# Patient Record
Sex: Female | Born: 1970
Health system: Southern US, Community
[De-identification: ages and names within clinical notes are randomized; demographics above are authoritative.]

## PROBLEM LIST (undated history)

## (undated) DIAGNOSIS — K219 Gastro-esophageal reflux disease without esophagitis: Secondary | ICD-10-CM

## (undated) DIAGNOSIS — N7093 Salpingitis and oophoritis, unspecified: Secondary | ICD-10-CM

## (undated) DIAGNOSIS — N946 Dysmenorrhea, unspecified: Secondary | ICD-10-CM

## (undated) DIAGNOSIS — M1990 Unspecified osteoarthritis, unspecified site: Secondary | ICD-10-CM

## (undated) DIAGNOSIS — M48061 Spinal stenosis, lumbar region without neurogenic claudication: Secondary | ICD-10-CM

## (undated) DIAGNOSIS — D689 Coagulation defect, unspecified: Secondary | ICD-10-CM

## (undated) DIAGNOSIS — R87619 Unspecified abnormal cytological findings in specimens from cervix uteri: Secondary | ICD-10-CM

## (undated) DIAGNOSIS — N736 Female pelvic peritoneal adhesions (postinfective): Secondary | ICD-10-CM

## (undated) DIAGNOSIS — R9082 White matter disease, unspecified: Secondary | ICD-10-CM

## (undated) DIAGNOSIS — K649 Unspecified hemorrhoids: Secondary | ICD-10-CM

## (undated) DIAGNOSIS — G629 Polyneuropathy, unspecified: Secondary | ICD-10-CM

## (undated) DIAGNOSIS — N920 Excessive and frequent menstruation with regular cycle: Secondary | ICD-10-CM

## (undated) DIAGNOSIS — I10 Essential (primary) hypertension: Secondary | ICD-10-CM

## (undated) DIAGNOSIS — T7840XA Allergy, unspecified, initial encounter: Secondary | ICD-10-CM

## (undated) DIAGNOSIS — D649 Anemia, unspecified: Secondary | ICD-10-CM

## (undated) DIAGNOSIS — F32A Depression, unspecified: Secondary | ICD-10-CM

## (undated) DIAGNOSIS — F419 Anxiety disorder, unspecified: Secondary | ICD-10-CM

## (undated) DIAGNOSIS — Z5189 Encounter for other specified aftercare: Secondary | ICD-10-CM

## (undated) HISTORY — DX: Polyneuropathy, unspecified: G62.9

## (undated) HISTORY — DX: Encounter for other specified aftercare: Z51.89

## (undated) HISTORY — PX: APPENDECTOMY: SHX54

## (undated) HISTORY — DX: Excessive and frequent menstruation with regular cycle: N92.0

## (undated) HISTORY — DX: Female pelvic peritoneal adhesions (postinfective): N73.6

## (undated) HISTORY — DX: Unspecified osteoarthritis, unspecified site: M19.90

## (undated) HISTORY — DX: Anxiety disorder, unspecified: F41.9

## (undated) HISTORY — DX: Anemia, unspecified: D64.9

## (undated) HISTORY — DX: Depression, unspecified: F32.A

## (undated) HISTORY — DX: Essential (primary) hypertension: I10

## (undated) HISTORY — DX: Unspecified hemorrhoids: K64.9

## (undated) HISTORY — DX: Dysmenorrhea, unspecified: N94.6

## (undated) HISTORY — PX: FRACTURE SURGERY: SHX138

## (undated) HISTORY — DX: Salpingitis and oophoritis, unspecified: N70.93

## (undated) HISTORY — DX: Unspecified abnormal cytological findings in specimens from cervix uteri: R87.619

## (undated) HISTORY — DX: Spinal stenosis, lumbar region without neurogenic claudication: M48.061

## (undated) HISTORY — DX: Gastro-esophageal reflux disease without esophagitis: K21.9

## (undated) HISTORY — PX: MANDIBLE SURGERY: SHX707

## (undated) HISTORY — DX: White matter disease, unspecified: R90.82

## (undated) HISTORY — DX: Coagulation defect, unspecified: D68.9

## (undated) HISTORY — DX: Allergy, unspecified, initial encounter: T78.40XA

---

## 2015-08-16 HISTORY — PX: ENDOMETRIAL ABLATION: SHX621

## 2015-10-10 DIAGNOSIS — N7093 Salpingitis and oophoritis, unspecified: Secondary | ICD-10-CM

## 2015-10-10 HISTORY — DX: Salpingitis and oophoritis, unspecified: N70.93

## 2015-10-10 HISTORY — PX: OOPHORECTOMY: SHX86

## 2016-01-21 ENCOUNTER — Encounter: Payer: Self-pay | Admitting: Physician Assistant

## 2016-01-21 ENCOUNTER — Ambulatory Visit (INDEPENDENT_AMBULATORY_CARE_PROVIDER_SITE_OTHER): Payer: Self-pay | Admitting: Physician Assistant

## 2016-01-21 VITALS — BP 157/102 | HR 63 | Wt 200.0 lb

## 2016-01-21 DIAGNOSIS — Z862 Personal history of diseases of the blood and blood-forming organs and certain disorders involving the immune mechanism: Secondary | ICD-10-CM

## 2016-01-21 DIAGNOSIS — G629 Polyneuropathy, unspecified: Secondary | ICD-10-CM

## 2016-01-21 DIAGNOSIS — I10 Essential (primary) hypertension: Secondary | ICD-10-CM

## 2016-01-21 DIAGNOSIS — I152 Hypertension secondary to endocrine disorders: Secondary | ICD-10-CM | POA: Insufficient documentation

## 2016-01-21 DIAGNOSIS — Z299 Encounter for prophylactic measures, unspecified: Secondary | ICD-10-CM

## 2016-01-21 DIAGNOSIS — G588 Other specified mononeuropathies: Secondary | ICD-10-CM

## 2016-01-21 DIAGNOSIS — Z206 Contact with and (suspected) exposure to human immunodeficiency virus [HIV]: Secondary | ICD-10-CM

## 2016-01-21 DIAGNOSIS — E559 Vitamin D deficiency, unspecified: Secondary | ICD-10-CM

## 2016-01-21 DIAGNOSIS — E1159 Type 2 diabetes mellitus with other circulatory complications: Secondary | ICD-10-CM | POA: Insufficient documentation

## 2016-01-21 HISTORY — DX: Polyneuropathy, unspecified: G62.9

## 2016-01-21 LAB — POCT HEMOGLOBIN: Hemoglobin: 14.1 g/dL (ref 12.2–16.2)

## 2016-01-21 MED ORDER — LISINOPRIL-HYDROCHLOROTHIAZIDE 20-25 MG PO TABS: 0.5000 | ORAL_TABLET | Freq: Every day | ORAL | 3 refills | Status: DC

## 2016-01-21 MED ORDER — GABAPENTIN 300 MG PO CAPS: ORAL_CAPSULE | ORAL | 3 refills | Status: DC

## 2016-01-21 NOTE — Progress Notes (Signed)
Jeanette [name] is a 45 y.o. female who presents to [facility] today to establish care  Subjective:   HTN: not currently on medication. Has taken "water pills" in the past. Denies headache, chest pain, dyspnea, lightheadedness, and edema. Eats a salt restricted diet. Family hx significant for HTN in first degree relatives.  Exposure to HIV:  Currently in a monogamous relationship with female partner who is HIV positive. He is taking anti-retrovirals and followed by ID at [facility]. She would like to be tested for HIV and is interested in pre-exposure prophylaxis. Denies fever, chills, fatigue, and malaise.  Peripheral neuropathy, right leg: states she developed sudden onset drop foot 6 months ago. She was admitted to the hospital, but does not know the cause of her neuropathy. Has been managed with Gabapentin, but has not taken medication in 2 weeks. Pain is worse at night and when lying down.  History of anemia: patient states she has a hx of severe anemia requiring monthly blood transfusions. She has a history of heavy menstrual periods and had an ablation two months ago. LMP was 2 months ago.   Current Outpatient Prescriptions  Medication Sig Dispense Refill  . gabapentin (NEURONTIN) 300 MG capsule One tab PO qHS for a week, then BID for a week, then TID. May double weekly to a max of 3,600mg /day 180 capsule 3  . lisinopril-hydrochlorothiazide (PRINZIDE,ZESTORETIC) 20-25 MG tablet Take 0.5 tablets by mouth daily. 30 tablet 3   No current facility-administered medications for this visit.      Past Medical History:  Diagnosis Date  . Anemia   . Hypertension    Surgical History: Salpingoophorectromy, right (2017)  Social History  Substance Use Topics  . Smoking status: Former Smoker    Quit date: 03/24/2015  . Smokeless tobacco: Never Used  . Alcohol use Yes   family  history includes Brain cancer in her brother; Diabetes in her brother, father, and mother; Heart attack in her father, paternal grandfather, and paternal grandmother; Hyperlipidemia in her father; Hypertension in her father and son.  ROS as above: negative except as noted in the HPI  Medications: Current Outpatient Prescriptions  Medication Sig Dispense Refill  . gabapentin (NEURONTIN) 300 MG capsule One tab PO qHS for a week, then BID for a week, then TID. May double weekly to a max of 3,600mg /day 180 capsule 3  . lisinopril-hydrochlorothiazide (PRINZIDE,ZESTORETIC) 20-25 MG tablet Take 0.5 tablets by mouth daily. 30 tablet 3   No current facility-administered medications for this visit.    No Known Allergies  Health Maintenance Health Maintenance  Topic Date Due  . HIV Screening  01/11/1984  . TETANUS/TDAP  01/11/1988  . PAP SMEAR  01/10/1990  . INFLUENZA VACCINE  09/09/2015     Exam:  BP (!) 157/102   Pulse 63   Wt 200 lb (90.7 kg)   Physical Exam  Constitutional: She appears healthy. No distress.  HENT:  Head: Normocephalic and atraumatic.  Eyes: EOM are normal.  Cardiovascular: Normal rate, regular rhythm, normal heart sounds and intact distal pulses.   No peripheral edema  Pulmonary/Chest: Effort normal and breath sounds normal.  Musculoskeletal: Normal range of motion.       Right upper leg: Normal.       Right lower leg: Normal.       Right foot: Normal.  No drop foot on right side noted today. Normal dorsiflexion and plantar flexion.  Neurological: She is alert. She has normal  sensation. She displays no tremor. Gait normal.  Skin: Skin is warm, dry and intact. No rash (on exposed skin) noted.  Psychiatric: Mood, affect and judgment normal.  Vitals reviewed.   Lab Results  Component Value Date   HGB 14.1 01/21/2016    Assessment and Plan: 45 y.o. female with uncontrolled HTN and peripheral neuropathy.  HTN: started low-dose Lisinopril-HCTZ and  instructed to return in 2 weeks for BP check  Peripheral neuropathy: refilled Gabapentin. Will titrate back to full dose. If pain is not well controlled, discussed adding another neuropathic pain agent and/or referral to sports medicine.  POCT Hgb performed in clinic today and normal. I suspect this was a microcytic iron deficiency anemia due to menorrhagia that has resolved since endometrial ablation.  Preventive care labs ordered today, including HIV screening. Also ordered screening labs for PreP initiation. Cost is a concern for her as she is self-pay. I have agreed to research programs in [facility] and contact [facility] to see if there is financial assistance.   Orders Placed This Encounter  Procedures  . HIV antibody  . Comprehensive metabolic panel    Order Specific Question:   Has the patient fasted?    Answer:   No  . CBC  . Hepatitis panel, acute  . Pregnancy, urine  . Vitamin B12  . Folate  . Iron and TIBC  . Ferritin  . Hemoglobin A1c  . Lipid panel    Order Specific Question:   Has the patient fasted?    Answer:   No  . VITAMIN D 25 Hydroxy (Vit-D Deficiency, Fractures)  . TSH  . POCT hemoglobin    Patient education and anticipatory guidance given Patient agrees with treatment plan Follow-up in 2 weeks or sooner as needed   Darlyne Russian PA-C

## 2016-01-21 NOTE — Patient Instructions (Signed)
Hypertension Hypertension, commonly called high blood pressure, is when the force of blood pumping through your arteries is too strong. Your arteries are the blood vessels that carry blood from your heart throughout your body. A blood pressure reading consists of a higher number over a lower number, such as 110/72. The higher number (systolic) is the pressure inside your arteries when your heart pumps. The lower number (diastolic) is the pressure inside your arteries when your heart relaxes. Ideally you want your blood pressure below 120/80. Hypertension forces your heart to work harder to pump blood. Your arteries may become narrow or stiff. Having untreated or uncontrolled hypertension can cause heart attack, stroke, kidney disease, and other problems. What increases the risk? Some risk factors for high blood pressure are controllable. Others are not. Risk factors you cannot control include:  Race. You may be at higher risk if you are African American.  Age. Risk increases with age.  Gender. Men are at higher risk than women before age 45 years. After age 65, women are at higher risk than men. Risk factors you can control include:  Not getting enough exercise or physical activity.  Being overweight.  Getting too much fat, sugar, calories, or salt in your diet.  Drinking too much alcohol. What are the signs or symptoms? Hypertension does not usually cause signs or symptoms. Extremely high blood pressure (hypertensive crisis) may cause headache, anxiety, shortness of breath, and nosebleed. How is this diagnosed? To check if you have hypertension, your health care provider will measure your blood pressure while you are seated, with your arm held at the level of your heart. It should be measured at least twice using the same arm. Certain conditions can cause a difference in blood pressure between your right and left arms. A blood pressure reading that is higher than normal on one occasion does  not mean that you need treatment. If it is not clear whether you have high blood pressure, you may be asked to return on a different day to have your blood pressure checked again. Or, you may be asked to monitor your blood pressure at home for 1 or more weeks. How is this treated? Treating high blood pressure includes making lifestyle changes and possibly taking medicine. Living a healthy lifestyle can help lower high blood pressure. You may need to change some of your habits. Lifestyle changes may include:  Following the DASH diet. This diet is high in fruits, vegetables, and whole grains. It is low in salt, red meat, and added sugars.  Keep your sodium intake below 2,300 mg per day.  Getting at least 30-45 minutes of aerobic exercise at least 4 times per week.  Losing weight if necessary.  Not smoking.  Limiting alcoholic beverages.  Learning ways to reduce stress. Your health care provider may prescribe medicine if lifestyle changes are not enough to get your blood pressure under control, and if one of the following is true:  You are 18-59 years of age and your systolic blood pressure is above 140.  You are 60 years of age or older, and your systolic blood pressure is above 150.  Your diastolic blood pressure is above 90.  You have diabetes, and your systolic blood pressure is over 140 or your diastolic blood pressure is over 90.  You have kidney disease and your blood pressure is above 140/90.  You have heart disease and your blood pressure is above 140/90. Your personal target blood pressure may vary depending on your medical   conditions, your age, and other factors. Follow these instructions at home:  Have your blood pressure rechecked as directed by your health care provider.  Take medicines only as directed by your health care provider. Follow the directions carefully. Blood pressure medicines must be taken as prescribed. The medicine does not work as well when you skip  doses. Skipping doses also puts you at risk for problems.  Do not smoke.  Monitor your blood pressure at home as directed by your health care provider. Contact a health care provider if:  You think you are having a reaction to medicines taken.  You have recurrent headaches or feel dizzy.  You have swelling in your ankles.  You have trouble with your vision. Get help right away if:  You develop a severe headache or confusion.  You have unusual weakness, numbness, or feel faint.  You have severe chest or abdominal pain.  You vomit repeatedly.  You have trouble breathing. This information is not intended to replace advice given to you by your health care provider. Make sure you discuss any questions you have with your health care provider. Document Released: 01/25/2005 Document Revised: 07/03/2015 Document Reviewed: 11/17/2012 Elsevier Interactive Patient Education  2017 Elsevier Inc.  

## 2016-01-22 LAB — COMPREHENSIVE METABOLIC PANEL
ALBUMIN: 4.2 g/dL (ref 3.6–5.1)
ALT: 8 U/L (ref 6–29)
AST: 12 U/L (ref 10–35)
Alkaline Phosphatase: 62 U/L (ref 33–115)
BILIRUBIN TOTAL: 0.4 mg/dL (ref 0.2–1.2)
BUN: 12 mg/dL (ref 7–25)
CALCIUM: 9.1 mg/dL (ref 8.6–10.2)
CO2: 22 mmol/L (ref 20–31)
Chloride: 106 mmol/L (ref 98–110)
Creat: 0.55 mg/dL (ref 0.50–1.10)
Glucose, Bld: 115 mg/dL — ABNORMAL HIGH (ref 65–99)
Potassium: 4 mmol/L (ref 3.5–5.3)
Sodium: 138 mmol/L (ref 135–146)
TOTAL PROTEIN: 7.4 g/dL (ref 6.1–8.1)

## 2016-01-22 LAB — VITAMIN B12: Vitamin B-12: 310 pg/mL (ref 200–1100)

## 2016-01-22 LAB — LIPID PANEL
CHOLESTEROL: 182 mg/dL (ref <200)
HDL: 48 mg/dL — ABNORMAL LOW (ref >50)
LDL Cholesterol: 121 mg/dL — ABNORMAL HIGH (ref <100)
Total CHOL/HDL Ratio: 3.8 Ratio (ref <5.0)
Triglycerides: 67 mg/dL (ref <150)
VLDL: 13 mg/dL (ref <30)

## 2016-01-22 LAB — CBC
HCT: 39.7 % (ref 35.0–45.0)
Hemoglobin: 13.6 g/dL (ref 11.7–15.5)
MCH: 30 pg (ref 27.0–33.0)
MCHC: 34.3 g/dL (ref 32.0–36.0)
MCV: 87.4 fL (ref 80.0–100.0)
MPV: 9.1 fL (ref 7.5–12.5)
PLATELETS: 280 10*3/uL (ref 140–400)
RBC: 4.54 MIL/uL (ref 3.80–5.10)
RDW: 13.7 % (ref 11.0–15.0)
WBC: 3.8 10*3/uL (ref 3.8–10.8)

## 2016-01-22 LAB — FOLATE: FOLATE: 16.2 ng/mL (ref >5.4)

## 2016-01-22 LAB — IRON AND TIBC
%SAT: 18 % (ref 11–50)
Iron: 68 ug/dL (ref 40–190)
TIBC: 388 ug/dL (ref 250–450)
UIBC: 320 ug/dL (ref 125–400)

## 2016-01-22 LAB — HEPATITIS PANEL, ACUTE
HCV Ab: NEGATIVE
HEP A IGM: NONREACTIVE
HEP B C IGM: NONREACTIVE
HEP B S AG: NEGATIVE

## 2016-01-22 LAB — HEMOGLOBIN A1C
HEMOGLOBIN A1C: 5.2 % (ref <5.7)
MEAN PLASMA GLUCOSE: 103 mg/dL

## 2016-01-22 LAB — TSH: TSH: 0.49 m[IU]/L

## 2016-01-22 LAB — FERRITIN: FERRITIN: 14 ng/mL (ref 10–232)

## 2016-01-22 LAB — HIV ANTIBODY (ROUTINE TESTING W REFLEX): HIV 1&2 Ab, 4th Generation: NONREACTIVE

## 2016-01-23 ENCOUNTER — Encounter: Payer: Self-pay | Admitting: Physician Assistant

## 2016-01-23 DIAGNOSIS — F17200 Nicotine dependence, unspecified, uncomplicated: Secondary | ICD-10-CM | POA: Insufficient documentation

## 2016-01-23 DIAGNOSIS — E786 Lipoprotein deficiency: Secondary | ICD-10-CM

## 2016-01-23 DIAGNOSIS — E785 Hyperlipidemia, unspecified: Secondary | ICD-10-CM | POA: Insufficient documentation

## 2016-01-23 DIAGNOSIS — E559 Vitamin D deficiency, unspecified: Secondary | ICD-10-CM | POA: Insufficient documentation

## 2016-01-23 LAB — PREGNANCY, URINE: Preg Test, Ur: NEGATIVE

## 2016-01-23 LAB — VITAMIN D 25 HYDROXY (VIT D DEFICIENCY, FRACTURES): VIT D 25 HYDROXY: 24 ng/mL — AB (ref 30–100)

## 2016-01-23 MED ORDER — VITAMIN D (ERGOCALCIFEROL) 1.25 MG (50000 UNIT) PO CAPS
50000.0000 [IU] | ORAL_CAPSULE | ORAL | 0 refills | Status: DC
Start: 2016-01-23 — End: 2016-07-23

## 2016-01-23 NOTE — Addendum Note (Signed)
Addended by: Nelson Chimes E on: 01/23/2016 09:55 AM   Modules accepted: Orders

## 2016-01-23 NOTE — Progress Notes (Signed)
I spoke to Jeanette [name] on the phone and discussed results of her lab work.  She is still interested in starting Truvada for PreP and is going to contact Jeanette [name] medical assistance program. Sending a prescription for Vit D 50,000 to her pharmacy today. Discussed her elevated LDL and that initial treatment will be smoking cessation, low-cholesterol diet and weight loss.  Darlyne Russian PA-C

## 2016-02-04 ENCOUNTER — Encounter: Payer: Self-pay | Admitting: Physician Assistant

## 2016-02-04 ENCOUNTER — Ambulatory Visit (INDEPENDENT_AMBULATORY_CARE_PROVIDER_SITE_OTHER): Payer: Self-pay | Admitting: Physician Assistant

## 2016-02-04 VITALS — BP 122/78 | HR 69 | Wt 204.0 lb

## 2016-02-04 DIAGNOSIS — D171 Benign lipomatous neoplasm of skin and subcutaneous tissue of trunk: Secondary | ICD-10-CM | POA: Insufficient documentation

## 2016-02-04 DIAGNOSIS — G8929 Other chronic pain: Secondary | ICD-10-CM | POA: Insufficient documentation

## 2016-02-04 DIAGNOSIS — G588 Other specified mononeuropathies: Secondary | ICD-10-CM

## 2016-02-04 DIAGNOSIS — E559 Vitamin D deficiency, unspecified: Secondary | ICD-10-CM

## 2016-02-04 DIAGNOSIS — Z23 Encounter for immunization: Secondary | ICD-10-CM

## 2016-02-04 DIAGNOSIS — M5441 Lumbago with sciatica, right side: Secondary | ICD-10-CM

## 2016-02-04 DIAGNOSIS — I10 Essential (primary) hypertension: Secondary | ICD-10-CM

## 2016-02-04 MED ORDER — LISINOPRIL-HYDROCHLOROTHIAZIDE 20-25 MG PO TABS: 0.5000 | ORAL_TABLET | Freq: Every day | ORAL | 3 refills | Status: DC

## 2016-02-04 MED ORDER — GABAPENTIN 300 MG PO CAPS: ORAL_CAPSULE | ORAL | 3 refills | Status: DC

## 2016-02-04 NOTE — Progress Notes (Signed)
   Procedure: Diagnostic Ultrasound of  left anterior abdominal mass Device: GE Logiq E  Findings: There is a isoechoic mass that measures approximately 2 cm x 3 cm x 0.9 cm. There is no internal echogenic structures, and there is no internal vascular flow Images permanently stored and available for review in the ultrasound unit.  Impression: 2 x 3 x 0.9 cm subcutaneous lipoma versus sebaceous cyst.

## 2016-02-04 NOTE — Progress Notes (Signed)
HPI:                                                                Jeanette [name] is a 45 y.o. female who presents to [facility] today to establish care   HTN: has not been taking Lisinopril-HCTZ because she lost the prescription. Not currently checking BP's at home. Denies headache, chest pain, dyspnea, lightheadedness, and edema.   Mononeuropathy: Right-sided neuropathy since 2010. Never had a workup. She was treated in an ED in [place] with a foot brace. She continues to have neuropathy which she describes as "foot sitting in an ice bucket." Endorses paresthesias and occasional numbness of her right foot.  She has been taking Gabapentin 300mg  nightly and this is not controlling her pain. Additionally she endorses low back pain with shooting pains radiating down the back of her right leg. Denies bowel or bladder symptoms.  Additionally patient noticed a lump one week ago on her left rib cage just underneath her breast. It is nontender and she has not noticed it changing in size.  Health Maintenance Health Maintenance  Topic Date Due  . TETANUS/TDAP  01/11/1988  . PAP SMEAR  01/10/1990  . INFLUENZA VACCINE  09/09/2015  . HIV Screening  Completed    Past Medical History:  Diagnosis Date  . Anemia   . Hypertension    Past Surgical History:  Procedure Laterality Date  . ENDOMETRIAL ABLATION  2017  . SALPINGOOPHORECTOMY Right 2017   for ovarian abscess   Social History  Substance Use Topics  . Smoking status: Former Smoker    Quit date: 03/24/2015  . Smokeless tobacco: Never Used  . Alcohol use Yes   family history includes Brain cancer in her brother; Diabetes in her brother, father, and mother; Heart attack in her father, paternal grandfather, and paternal grandmother; Hyperlipidemia in her father; Hypertension in her father and son.  ROS: negative except as noted in the HPI  Medications: Current Outpatient Prescriptions   Medication Sig Dispense Refill  . gabapentin (NEURONTIN) 300 MG capsule One tab PO qHS for a week, then BID for a week, then TID. May double weekly to a max of 3,600mg /day (Patient not taking: Reported on 02/04/2016) 180 capsule 3  . lisinopril-hydrochlorothiazide (PRINZIDE,ZESTORETIC) 20-25 MG tablet Take 0.5 tablets by mouth daily. (Patient not taking: Reported on 02/04/2016) 30 tablet 3  . Vitamin D, Ergocalciferol, (DRISDOL) 50000 units CAPS capsule Take 1 capsule (50,000 Units total) by mouth every 7 (seven) days. Take for 8 total doses(weeks) (Patient not taking: Reported on 02/04/2016) 8 capsule 0   No current facility-administered medications for this visit.    No Known Allergies     Objective:  BP 122/78   Pulse 69   Wt 204 lb (92.5 kg)  Gen: well-groomed, not ill-appearing, no distress Lungs: Normal work of breathing, clear to auscultation bilaterally Heart: Normal rate, regular rhythm, s1 and s2 distinct, no murmurs, clicks or rubs appreciated on this exam Abd: Soft. Nondistended, Nontender, there is a 2.3cmx3.07cmx0.9cm soft, freely mobile mass on the left abdominal wall inferior to the breast Extremities: distal pulses intact, no peripheral edema Musculoskeletal: positive straight-leg raise test, right side. Decreased right-leg extension compared to left. Neuro: alert and oriented x 3,  EOM's intact, normal strength in b/l lower extremities, normal gait  Skin: warm and dry, scattered healing bug bites on bilateral lower extemeties Psych: normal affect, pleasant mood, normal speech  Assessment and Plan: 45 y.o. female with   1. Vitamin D insufficiency - cont OTC vitamin d3 2000U supplementation  2. Essential hypertension - logging BP's at home this month. Holding medication until we have at least 1 week of readings - lisinopril-hydrochlorothiazide (PRINZIDE,ZESTORETIC) 20-25 mg tab  3. Other mononeuropathy - referred to Sports medicine for workup - told patient to  request her records incl. any imaging performed  - gabapentin (NEURONTIN) 300 MG capsule; One tab PO BID for a week, then TID. May double weekly to a max of 3,600mg /day  Dispense: 180 capsule; Refill: 3  4. Lipoma of abdominal wall - bedside US performed today by Dr. Dianah Field consistent with lipoma - Not currently symptomatic. Will manage with watchful waiting  6. Chronic right-sided low back pain with right-sided sciatica - referred to Sports medicine for workup  TDaP and Flu vaccine Quad given today She is scheduling an appointment for her Pap smear  Patient education and anticipatory guidance given Patient agrees with treatment plan Follow-up in 1 month for Pap and Prep/Truvada  Darlyne Russian PA-C

## 2016-02-04 NOTE — Patient Instructions (Signed)
Please make an appointment with Sports Medicine (dr. T or Dr. Georgina Snell) for your back pain and neuropathy Please make an appointment with me for your Pap smear (cervical cancer screening) Start logging your blood pressures  Lipoma Introduction A lipoma is a noncancerous (benign) tumor that is made up of fat cells. This is a very common type of soft-tissue growth. Lipomas are usually found under the skin (subcutaneous). They may occur in any tissue of the body that contains fat. Common areas for lipomas to appear include the back, shoulders, buttocks, and thighs. Lipomas grow slowly, and they are usually painless. Most lipomas do not cause problems and do not require treatment. What are the causes? The cause of this condition is not known. What increases the risk? This condition is more likely to develop in:  People who are 1-42 years old.  People who have a family history of lipomas. What are the signs or symptoms? A lipoma usually appears as a small, round bump under the skin. It may feel soft or rubbery, but the firmness can vary. Most lipomas are not painful. However, a lipoma may become painful if it is located in an area where it pushes on nerves. How is this diagnosed? A lipoma can usually be diagnosed with a physical exam. You may also have tests to confirm the diagnosis and to rule out other conditions. Tests may include:  Imaging tests, such as a CT scan or MRI.  Removal of a tissue sample to be looked at under a microscope (biopsy). How is this treated? Treatment is not needed for small lipomas that are not causing problems. If a lipoma continues to get bigger or it causes problems, removal is often the best option. Lipomas can also be removed to improve appearance. Removal of a lipoma is usually done with a surgery in which the fatty cells and the surrounding capsule are removed. Most often, a medicine that numbs the area (local anesthetic) is used for this procedure. Follow these  instructions at home:  Keep all follow-up visits as directed by your health care provider. This is important. Contact a health care provider if:  Your lipoma becomes larger or hard.  Your lipoma becomes painful, red, or increasingly swollen. These could be signs of infection or a more serious condition. This information is not intended to replace advice given to you by your health care provider. Make sure you discuss any questions you have with your health care provider. Document Released: 01/15/2002 Document Revised: 07/03/2015 Document Reviewed: 01/21/2014  2017 Elsevier

## 2016-02-19 ENCOUNTER — Institutional Professional Consult (permissible substitution): Payer: Self-pay | Admitting: Sports Medicine

## 2016-02-19 ENCOUNTER — Ambulatory Visit: Payer: Self-pay | Admitting: Physician Assistant

## 2016-02-23 ENCOUNTER — Ambulatory Visit: Payer: Self-pay | Admitting: Physician Assistant

## 2016-02-23 ENCOUNTER — Ambulatory Visit (INDEPENDENT_AMBULATORY_CARE_PROVIDER_SITE_OTHER): Payer: Self-pay | Admitting: Sports Medicine

## 2016-02-23 ENCOUNTER — Ambulatory Visit (INDEPENDENT_AMBULATORY_CARE_PROVIDER_SITE_OTHER): Payer: Self-pay

## 2016-02-23 DIAGNOSIS — G8929 Other chronic pain: Secondary | ICD-10-CM

## 2016-02-23 DIAGNOSIS — M129 Arthropathy, unspecified: Secondary | ICD-10-CM

## 2016-02-23 DIAGNOSIS — M5116 Intervertebral disc disorders with radiculopathy, lumbar region: Secondary | ICD-10-CM

## 2016-02-23 DIAGNOSIS — R1909 Other intra-abdominal and pelvic swelling, mass and lump: Secondary | ICD-10-CM

## 2016-02-23 DIAGNOSIS — M5441 Lumbago with sciatica, right side: Secondary | ICD-10-CM

## 2016-02-23 NOTE — Progress Notes (Signed)
   Subjective:    I'm seeing this patient as a consultation for:   Jeanette Chimes, PA-C  CC:  Right leg pain and weakness  HPI: For months this pleasant 46 year old female has had weakness, pain, slightly in her back but mostly in her right lower leg, with numbness and tingling over the entirety of the right foot but mostly over the great toe and significant weakness and heaviness of the leg itself, no bowel or bladder dysfunction, saddle numbness, no constitutional symptoms, no trauma, tells me she's been in the emergency department multiple times and carries a tentative diagnosis of neuropathy, has never had advanced imaging. She does have mild back pain.  Past medical history:  Negative.  See flowsheet/record as well for more information.  Surgical history: Negative.  See flowsheet/record as well for more information.  Family history: Negative.  See flowsheet/record as well for more information.  Social history: Negative.  See flowsheet/record as well for more information.  Allergies, and medications have been entered into the medical record, reviewed, and no changes needed.   Review of Systems: No headache, visual changes, nausea, vomiting, diarrhea, constipation, dizziness, abdominal pain, skin rash, fevers, chills, night sweats, weight loss, swollen lymph nodes, body aches, joint swelling, muscle aches, chest pain, shortness of breath, mood changes, visual or auditory hallucinations.   Objective:   General: Well Developed, well nourished, and in no acute distress.  Neuro/Psych: Alert and oriented x3, extra-ocular muscles intact, able to move all 4 extremities, sensation grossly intact. Skin: Warm and dry, no rashes noted.  Respiratory: Not using accessory muscles, speaking in full sentences, trachea midline.  Cardiovascular: Pulses palpable, no extremity edema. Abdomen: Does not appear distended. Back Exam:  Inspection: Unremarkable  Motion: Flexion 45 deg, Extension 45 deg, Side  Bending to 45 deg bilaterally,  Rotation to 45 deg bilaterally  SLR laying: Negative  XSLR laying: Negative  Palpable tenderness: None. FABER: negative. Sensory change: Gross sensation intact to all lumbar and sacral dermatomes.  Reflexes: 2+ at both patellar tendons, 2+ at achilles tendons, Babinski's downgoing.  Strength at foot  Plantar-flexion: 4/5 Dorsi-flexion: 4/5 Eversion: 4/5 Inversion: 4/5  Leg strength  Quad: 5/5 Hamstring: 5/5 Hip flexor: 5/5 Hip abductors: 5/5  Gait unremarkable.  MRI reviewed and shows multiple spondylytic process is but most severe at the L4-L5 level with severe spinal stenosis.  Impression and Recommendations:   This case required medical decision making of moderate complexity.  Chronic right-sided low back pain with right-sided sciatica Foot drop on the right, significant weakness to dorsiflexion compared to the contralateral side and numbness to the big toe predominantly in an L4 type distribution as expected. She does have some back pain. Has never had advanced imaging but has had x-rays in the past that were nondiagnostic, has never had a nerve conduction study/EMG. If I do see a large L4-L5 protruding disc contacting the L4 nerve root we will proceed with epidural, if not we will proceed with nerve conduction/EMG.  There is severe spinal stenosis at the L4-L5 level, this is likely causing her weakness and leg symptoms, I'm going to order a bilateral L4-L5 transforaminal epidural but she also needs to touch base with spine surgery.

## 2016-02-23 NOTE — Assessment & Plan Note (Addendum)
Foot drop on the right, significant weakness to dorsiflexion compared to the contralateral side and numbness to the big toe predominantly in an L4 type distribution as expected. She does have some back pain. Has never had advanced imaging but has had x-rays in the past that were nondiagnostic, has never had a nerve conduction study/EMG. If I do see a large L4-L5 protruding disc contacting the L4 nerve root we will proceed with epidural, if not we will proceed with nerve conduction/EMG.  There is severe spinal stenosis at the L4-L5 level, this is likely causing her weakness and leg symptoms, I'm going to order a bilateral L4-L5 transforaminal epidural but she also needs to touch base with spine surgery.

## 2016-02-27 ENCOUNTER — Institutional Professional Consult (permissible substitution): Payer: Self-pay | Admitting: Sports Medicine

## 2016-02-27 ENCOUNTER — Ambulatory Visit: Payer: Self-pay | Admitting: Physician Assistant

## 2016-02-27 ENCOUNTER — Telehealth: Payer: Self-pay

## 2016-02-27 NOTE — Telephone Encounter (Addendum)
Called pt to notify her that we submitted the prep paperwork. The company [employer] is requesting documentation of income. This can be in the form of 1040 tax form, 2 recent pay stubs within the last 90 days, or a letter from the employer with current salary.  I was unable to reach pt.  Vm left for pt to return call to clinic.

## 2016-03-02 ENCOUNTER — Ambulatory Visit: Payer: Self-pay | Admitting: Physician Assistant

## 2016-03-10 ENCOUNTER — Ambulatory Visit (INDEPENDENT_AMBULATORY_CARE_PROVIDER_SITE_OTHER): Payer: Self-pay | Admitting: Physician Assistant

## 2016-03-10 ENCOUNTER — Other Ambulatory Visit (HOSPITAL_COMMUNITY)
Admission: RE | Admit: 2016-03-10 | Discharge: 2016-03-10 | Disposition: A | Discharge status: Home / Self Care | Payer: Self-pay | Source: Ambulatory Visit | Attending: Physician Assistant | Admitting: Physician Assistant

## 2016-03-10 ENCOUNTER — Encounter: Payer: Self-pay | Admitting: Physician Assistant

## 2016-03-10 VITALS — BP 151/92 | HR 61 | Wt 208.0 lb

## 2016-03-10 DIAGNOSIS — G588 Other specified mononeuropathies: Secondary | ICD-10-CM

## 2016-03-10 DIAGNOSIS — M48061 Spinal stenosis, lumbar region without neurogenic claudication: Secondary | ICD-10-CM

## 2016-03-10 DIAGNOSIS — I10 Essential (primary) hypertension: Secondary | ICD-10-CM

## 2016-03-10 DIAGNOSIS — Z01419 Encounter for gynecological examination (general) (routine) without abnormal findings: Secondary | ICD-10-CM | POA: Insufficient documentation

## 2016-03-10 DIAGNOSIS — Z0000 Encounter for general adult medical examination without abnormal findings: Secondary | ICD-10-CM

## 2016-03-10 DIAGNOSIS — Z9189 Other specified personal risk factors, not elsewhere classified: Secondary | ICD-10-CM

## 2016-03-10 DIAGNOSIS — Z1151 Encounter for screening for human papillomavirus (HPV): Secondary | ICD-10-CM | POA: Insufficient documentation

## 2016-03-10 DIAGNOSIS — K5900 Constipation, unspecified: Secondary | ICD-10-CM | POA: Insufficient documentation

## 2016-03-10 DIAGNOSIS — M48062 Spinal stenosis, lumbar region with neurogenic claudication: Secondary | ICD-10-CM | POA: Insufficient documentation

## 2016-03-10 HISTORY — DX: Spinal stenosis, lumbar region without neurogenic claudication: M48.061

## 2016-03-10 MED ORDER — GABAPENTIN 300 MG PO CAPS: ORAL_CAPSULE | ORAL | 3 refills | Status: DC

## 2016-03-10 MED ORDER — LISINOPRIL-HYDROCHLOROTHIAZIDE 10-12.5 MG PO TABS: 1.0000 | ORAL_TABLET | Freq: Every day | ORAL | 1 refills | Status: DC

## 2016-03-10 MED ORDER — MELOXICAM 15 MG PO TABS: 15.0000 mg | ORAL_TABLET | Freq: Every day | ORAL | 0 refills | Status: DC

## 2016-03-10 NOTE — Progress Notes (Signed)
HPI:                                                                Jeanette [name] is a 46 y.o. female who presents to [facility] today for Pap smear  Current Concerns include right hip pain, peripheral neuropathy and constipation  Patient is s/p ablation for menorrhagia and s/p right salpinogo-oophorectomy for a tubo-ovarian abscess. Reports spotting 1 day this month. Denies DUB. Denies abnormal vaginal discharge. Denies dyspareunia. Currently sexually active with 1 female partner.   Denies breast lumps, pain, nipple discharge or skin changes. She has never had a screening mammogram  Patient reports constipation for the last 6 weeks. Reports she is only having 1 bowel movement every 3 days. Has tried daily stool softeners. States she took Miralax and prune juice and was able to have a bowel movement the next day. She reports some blood when she wipes after straining. She has a history of hemorrhoids. Denies melena, tenesmus, or abdominal pain.  Patient c/o right hip pain today. She recently started working fulltime for [employer] and is on her feet for 10-hour shifts 50 hours per week. Patient was recently diagnosed with severe L4-L5 spinal stenosis and has had chronic right-sided peripheral neuropathy for the past year. She has been seen by Dr. Dianah Field once on 02/23/16. She has not followed up with Orthopedic Surgery because she does not want to have back surgery. She has been taking Ibuprofen and Gabapentin 300mg  one - two times daily.  Health Maintenance Health Maintenance  Topic Date Due  . PAP SMEAR  01/10/1990  . TETANUS/TDAP  02/03/2026  . INFLUENZA VACCINE  Completed  . HIV Screening  Completed    GYN/Sexual Health  Menstrual status: having irregular periods, ablation  LMP: January 2018  Menses: irregular  Last pap smear: >5 years ago  History of abnormal pap smears: yes, unknown  Sexually active: yes  Current  contraception: condoms  Past Medical History:  Diagnosis Date  . Abnormal Pap smear of cervix   . Anemia   . Dysmenorrhea   . Hemorrhoids   . Hypertension   . Menorrhagia   . Pelvic adhesions   . Peripheral neuropathy ([place]) 01/21/2016  . Spinal stenosis at L4-L5 level 03/10/2016  . Tubo-ovarian abscess 10/2015   Past Surgical History:  Procedure Laterality Date  . APPENDECTOMY    . ENDOMETRIAL ABLATION  08/16/2015  . MANDIBLE SURGERY    . OOPHORECTOMY Right 10/2015   for ovarian abscess   Social History  Substance Use Topics  . Smoking status: Former Smoker    Quit date: 03/24/2015  . Smokeless tobacco: Never Used  . Alcohol use Yes   family history includes Brain cancer in her brother; Diabetes in her brother, father, and mother; Heart attack in her father, paternal grandfather, and paternal grandmother; Hyperlipidemia in her father; Hypertension in her father and son.  Review of Systems  Constitutional: Negative for chills, fever, malaise/fatigue and weight loss.  HENT: Negative.   Respiratory: Negative.   Cardiovascular: Negative.   Gastrointestinal: Positive for blood in stool and constipation. Negative for abdominal pain, melena and nausea.  Genitourinary: Negative.   Musculoskeletal: Positive for joint pain (right hip) and myalgias (right leg).  Skin: Negative  for rash.  Neurological: Positive for sensory change (right leg paresthesias) and focal weakness (right foot drop).     Medications: Current Outpatient Prescriptions  Medication Sig Dispense Refill  . gabapentin (NEURONTIN) 300 MG capsule One tab PO BID for a week, then TID. May double weekly to a max of 3,600mg /day 180 capsule 3  . lisinopril-hydrochlorothiazide (PRINZIDE,ZESTORETIC) 20-25 MG tablet Take 0.5 tablets by mouth daily. 30 tablet 3  . Vitamin D, Ergocalciferol, (DRISDOL) 50000 units CAPS capsule Take 1 capsule (50,000 Units total) by mouth every 7 (seven) days. Take for 8 total doses(weeks) 8  capsule 0  . meloxicam (MOBIC) 15 MG tablet Take 1 tablet (15 mg total) by mouth daily. 30 tablet 0   No current facility-administered medications for this visit.    No Known Allergies     Objective:  BP (!) 151/92   Pulse 61   Wt 208 lb (94.3 kg)  Physical Exam  Genitourinary: Rectal exam shows external hemorrhoid. Rectal exam shows no tenderness. Pelvic exam was performed with patient supine. There is no lesion on the right labia. There is no lesion on the left labia. Cervix exhibits no friability. No erythema or bleeding in the vagina. Vaginal discharge (moderate amount of thin, white) found.    Genitourinary Comments: 1-61mm erythematous papule at the 10 o'clock position of the cervix  Lymphadenopathy:       Right: No inguinal adenopathy present.       Left: No inguinal adenopathy present.     No results found for this or any previous visit (from the past 72 hour(s)). No results found.    Assessment and Plan: 46 y.o. female with    Other mononeuropathy, Spinal stenosis at L4-L5 level - discussed with patient that Sports Medicine recommended epidural injections, which is different from invasive surgery. Encouraged her to at least make the orthopedic appointment and discuss her treatment options and the risks/benefits - starting daily Meloxicam. Instructed to d/c all other OTC NSAIDs. Tylenol ok. - increase Gabapentin to 600mg  BID - follow-up with Sports Medicine as needed - meloxicam (MOBIC) 15 MG tablet; Take 1 tablet (15 mg total) by mouth daily.  Dispense: 30 tablet; Refill: 0 - gabapentin (NEURONTIN) 300 MG capsule; One tab PO BID for a week, then TID. May double weekly to a max of 3,600mg /day  Dispense: 180 capsule; Refill: 3  Encounter for preventative adult health care examination - cervical lesion is benign appearing and may reflect changes from previous colposcopies/procedures. Pending results of Pap, may recommend repeat pelvic exam in 1 year - Cytology -  PAP - MM Digital Screening; Future  Elevated blood pressure reading in office with diagnosis of hypertension - patient self-discontinued her lisinopril-hct because she believed her blood pressure improved so she no longer needed the medication. She is asymptomatic today - no vision change, chest pain, lightheadedness, headache, edema - patient educated that hypertension is a chronic condition and will return if untreated. Patient expressed understanding. - patient was in range on 1/2 tab of lisinopril-hct 20-25 so will adjust dose and send 90-day supply  At risk for HIV due to heterosexual contact - patient and I have been working together with Philis Fendt to get her coverage for Truvada. She still needs to provide proof of income. - discussed the risks of transmission based on the Partners and HPTN 052 studies, which found zero transmission if the HIV positive partner has a viral load <200 - in her case, we discussed that she confirm with her partner  and his physician that his viral load is undetectable before engaging in condomless sex  Constipation, unspecified constipation type - cont daily Colace and Miralax as needed - increase dietary fiber - follow-up in 1-2 months   Patient education and anticipatory guidance given Patient agrees with treatment plan Follow-up 1 month or sooner as needed  Darlyne Russian PA-C

## 2016-03-10 NOTE — Patient Instructions (Signed)
Meloxicam 15mg  daily (no other OTC pain relievers except Tylenol) Increase Gabapentin to 600mg  BID Follow-up with spine surgeon  Mammogram ordered. Make sure you check your voicemail so they can schedule you :)

## 2016-03-11 NOTE — Telephone Encounter (Signed)
Pt notified during visit

## 2016-03-17 LAB — CYTOLOGY - PAP
Diagnosis: NEGATIVE
HPV (WINDOPATH): NOT DETECTED

## 2016-03-22 ENCOUNTER — Ambulatory Visit: Payer: Self-pay | Admitting: Sports Medicine

## 2016-03-24 ENCOUNTER — Ambulatory Visit: Payer: Self-pay

## 2016-04-06 ENCOUNTER — Ambulatory Visit (INDEPENDENT_AMBULATORY_CARE_PROVIDER_SITE_OTHER): Payer: Self-pay | Admitting: Physician Assistant

## 2016-04-06 VITALS — BP 123/57 | HR 62

## 2016-04-06 DIAGNOSIS — Z111 Encounter for screening for respiratory tuberculosis: Secondary | ICD-10-CM

## 2016-04-06 DIAGNOSIS — Z23 Encounter for immunization: Secondary | ICD-10-CM

## 2016-04-06 NOTE — Progress Notes (Signed)
Patient came into clinic today for PPD placement. This is required for her job. Pt state she has had a PPD test in the past, never remembers testing positive. Pt tolerated placement in right forearm well, no immediate complications. 2 day follow up scheduled for PPD read. No further questions.

## 2016-04-08 ENCOUNTER — Ambulatory Visit (INDEPENDENT_AMBULATORY_CARE_PROVIDER_SITE_OTHER): Payer: Self-pay | Admitting: Physician Assistant

## 2016-04-08 VITALS — BP 120/68 | HR 63 | Ht 66.0 in | Wt 208.0 lb

## 2016-04-08 DIAGNOSIS — Z111 Encounter for screening for respiratory tuberculosis: Secondary | ICD-10-CM

## 2016-04-08 LAB — TB SKIN TEST
Induration: 0 mm
TB Skin Test: NEGATIVE

## 2016-04-08 NOTE — Progress Notes (Signed)
   Subjective:    Patient ID: Jeanette [name], female    DOB: [birth year], 46 y.o.   MRN: [id number]  HPI Pt is here for a PPD read.   Review of Systems     Objective:   Physical Exam        Assessment & Plan:  Results were negative 0 mm.

## 2016-04-12 ENCOUNTER — Telehealth: Payer: Self-pay | Admitting: Physician Assistant

## 2016-04-12 ENCOUNTER — Telehealth: Payer: Self-pay

## 2016-04-12 DIAGNOSIS — Z9189 Other specified personal risk factors, not elsewhere classified: Secondary | ICD-10-CM

## 2016-04-12 NOTE — Telephone Encounter (Signed)
We do need to repeat her labs since they are 3 months old now before we can start the medicine. She will need to be tested for HIV every 3 months and periodically we will also need to check kidney function. She can go to the lab at her convenience this week and as soon as I have the results I will send the prescription to the pharmacy. Tell her to make an appt to see me in 1 month

## 2016-04-12 NOTE — Telephone Encounter (Signed)
Pt got her approval letter from [employer]. She is wanting the Rx sent to her pharmacy.

## 2016-04-12 NOTE — Telephone Encounter (Signed)
Patient has received approval from [place] to start Truvada for PrEP.   Patient had labs in December. HBV screening test was negative, Scr 0.55, estimated CrCl 189. She is not taking any other HIV or HBV medications. She has had a uterine ablation and is not planning to become pregnant.  Repeating HIV antibody before initiating Truvada. Will check Q75months. Instructed to schedule 1 month follow-up appointment.

## 2016-04-12 NOTE — Telephone Encounter (Signed)
Pt.notified

## 2016-04-13 ENCOUNTER — Other Ambulatory Visit: Payer: Self-pay

## 2016-04-13 ENCOUNTER — Ambulatory Visit: Payer: Self-pay | Admitting: Sports Medicine

## 2016-04-13 DIAGNOSIS — Z9189 Other specified personal risk factors, not elsewhere classified: Secondary | ICD-10-CM

## 2016-04-14 LAB — HIV ANTIBODY (ROUTINE TESTING W REFLEX): HIV 1&2 Ab, 4th Generation: NONREACTIVE

## 2016-04-14 MED ORDER — EMTRICITABINE-TENOFOVIR DF 200-300 MG PO TABS: 1.0000 | ORAL_TABLET | Freq: Every day | ORAL | 2 refills | Status: DC

## 2016-04-14 NOTE — Progress Notes (Signed)
Patient's HIV antibody nonreactive. Sent Truvada to pharmacy. Repeat HIV and BMP in 3 months.

## 2016-04-15 ENCOUNTER — Ambulatory Visit: Payer: Self-pay

## 2016-05-12 ENCOUNTER — Ambulatory Visit: Payer: Self-pay

## 2016-05-12 ENCOUNTER — Ambulatory Visit (INDEPENDENT_AMBULATORY_CARE_PROVIDER_SITE_OTHER): Payer: Self-pay | Admitting: Physician Assistant

## 2016-05-12 VITALS — BP 148/81 | HR 64 | Ht 66.0 in | Wt 208.0 lb

## 2016-05-12 DIAGNOSIS — Z111 Encounter for screening for respiratory tuberculosis: Secondary | ICD-10-CM

## 2016-05-12 DIAGNOSIS — I10 Essential (primary) hypertension: Secondary | ICD-10-CM

## 2016-05-12 MED ORDER — HYDROCHLOROTHIAZIDE 25 MG PO TABS: 25.0000 mg | ORAL_TABLET | Freq: Every day | ORAL | 11 refills | Status: DC

## 2016-05-12 NOTE — Progress Notes (Signed)
   Subjective:    Patient ID: Jeanette [name], female    DOB: 1970-09-17, 46 y.o.   MRN: [id number]  HPI Pt is here for a PPD Placement.    Review of Systems     Objective:   Physical Exam        Assessment & Plan:  Pt tolerated injection well in left arm without complications. Pt advised to take Vitamin D over the counter. Rx for Hydrochlorothiazide was sent to the pharmacy per Nelson Chimes PA-C. Pt advised to return in 48-72 hours for PPD read. Pt also advised to schedule a nurse visit in 2 weeks for BP recheck.

## 2016-05-12 NOTE — Progress Notes (Signed)
Patient self-discontinued her lisinopril-hydrochlorothiazide due to change in insurance and cost. Blood pressure out of range at nurse visit today. New prescription sent that is on the [facility] $4 list  1. Essential hypertension - hydrochlorothiazide (HYDRODIURIL) 25 MG tablet; Take 1 tablet (25 mg total) by mouth daily.  Dispense: 30 tablet; Refill: 11

## 2016-05-14 ENCOUNTER — Ambulatory Visit (INDEPENDENT_AMBULATORY_CARE_PROVIDER_SITE_OTHER): Payer: Self-pay | Admitting: Physician Assistant

## 2016-05-14 VITALS — BP 122/80 | HR 58 | Wt 206.0 lb

## 2016-05-14 DIAGNOSIS — Z111 Encounter for screening for respiratory tuberculosis: Secondary | ICD-10-CM

## 2016-05-14 LAB — TB SKIN TEST
Induration: 0 mm
TB Skin Test: NEGATIVE

## 2016-05-14 NOTE — Progress Notes (Signed)
Pt came into clinic today for PPD read. Pt reports this is required for her job. PPD test was negative. Printed copy given to Pt. Pt also reports she just picked up the new Rx for HCTZ from the pharmacy. She plans to start it today. No further questions/concerns.

## 2016-05-26 ENCOUNTER — Ambulatory Visit: Payer: Self-pay

## 2016-06-25 ENCOUNTER — Ambulatory Visit: Payer: Self-pay | Admitting: Sports Medicine

## 2016-06-25 ENCOUNTER — Ambulatory Visit: Payer: Self-pay | Admitting: Physician Assistant

## 2016-07-08 ENCOUNTER — Other Ambulatory Visit: Payer: Self-pay

## 2016-07-08 DIAGNOSIS — Z79899 Other long term (current) drug therapy: Secondary | ICD-10-CM

## 2016-07-08 DIAGNOSIS — Z9189 Other specified personal risk factors, not elsewhere classified: Secondary | ICD-10-CM

## 2016-07-08 DIAGNOSIS — Z206 Contact with and (suspected) exposure to human immunodeficiency virus [HIV]: Secondary | ICD-10-CM

## 2016-07-16 ENCOUNTER — Ambulatory Visit: Payer: Self-pay | Admitting: Physician Assistant

## 2016-07-16 DIAGNOSIS — Z0189 Encounter for other specified special examinations: Secondary | ICD-10-CM

## 2016-07-16 DIAGNOSIS — Z206 Contact with and (suspected) exposure to human immunodeficiency virus [HIV]: Secondary | ICD-10-CM | POA: Insufficient documentation

## 2016-07-16 DIAGNOSIS — Z79899 Other long term (current) drug therapy: Secondary | ICD-10-CM | POA: Insufficient documentation

## 2016-07-17 LAB — BASIC METABOLIC PANEL
BUN: 14 mg/dL (ref 7–25)
CHLORIDE: 108 mmol/L (ref 98–110)
CO2: 23 mmol/L (ref 20–31)
Calcium: 9.1 mg/dL (ref 8.6–10.2)
Creat: 0.6 mg/dL (ref 0.50–1.10)
Glucose, Bld: 117 mg/dL — ABNORMAL HIGH (ref 65–99)
POTASSIUM: 3.7 mmol/L (ref 3.5–5.3)
Sodium: 140 mmol/L (ref 135–146)

## 2016-07-17 LAB — HIV ANTIBODY (ROUTINE TESTING W REFLEX): HIV 1&2 Ab, 4th Generation: NONREACTIVE

## 2016-07-20 ENCOUNTER — Other Ambulatory Visit: Payer: Self-pay | Admitting: Physician Assistant

## 2016-07-20 ENCOUNTER — Telehealth: Payer: Self-pay | Admitting: Physician Assistant

## 2016-07-20 DIAGNOSIS — Z9189 Other specified personal risk factors, not elsewhere classified: Secondary | ICD-10-CM

## 2016-07-20 MED ORDER — EMTRICITABINE-TENOFOVIR DF 200-300 MG PO TABS: 1.0000 | ORAL_TABLET | Freq: Every day | ORAL | 0 refills | Status: DC

## 2016-07-20 NOTE — Progress Notes (Signed)
Please remind patient that it is her responsibility to come to her appointments as scheduled so that she does not run out of her medication. 30-day supply sent without refills

## 2016-07-20 NOTE — Telephone Encounter (Signed)
Pt notified -EH/RMA  

## 2016-07-20 NOTE — Progress Notes (Signed)
HIV anitbody negative Normal renal function Will continue Truvada and recheck HIV in 3 months Patient needs to come in for her follow-up appointment for refills

## 2016-07-20 NOTE — Telephone Encounter (Signed)
Patient no-showed to her appointment on Friday. She cannot get her medication refills until she has her 30-month follow-up appointment. Labs have just been resulted.

## 2016-07-20 NOTE — Telephone Encounter (Signed)
Patient called asking about her lab results because she hasnt heard anything said she needs her meds refilled but cant get filled until get lab results back and has called yesterday spk w/ Evoni, left a voicemail yesterday and now has called today. I adv pt the lab results have not been read yet and that is why she has not received a call about her results will send a phone message to Doctor. Thanks

## 2016-07-23 ENCOUNTER — Encounter: Payer: Self-pay | Admitting: Physician Assistant

## 2016-07-23 ENCOUNTER — Ambulatory Visit (INDEPENDENT_AMBULATORY_CARE_PROVIDER_SITE_OTHER): Payer: Self-pay | Admitting: Physician Assistant

## 2016-07-23 VITALS — BP 155/98 | HR 56 | Wt 213.0 lb

## 2016-07-23 DIAGNOSIS — N926 Irregular menstruation, unspecified: Secondary | ICD-10-CM

## 2016-07-23 DIAGNOSIS — Z206 Contact with and (suspected) exposure to human immunodeficiency virus [HIV]: Secondary | ICD-10-CM

## 2016-07-23 DIAGNOSIS — Z79899 Other long term (current) drug therapy: Secondary | ICD-10-CM

## 2016-07-23 DIAGNOSIS — Z9102 Food additives allergy status: Secondary | ICD-10-CM

## 2016-07-23 DIAGNOSIS — I10 Essential (primary) hypertension: Secondary | ICD-10-CM

## 2016-07-23 LAB — POCT URINE PREGNANCY: Preg Test, Ur: NEGATIVE

## 2016-07-23 MED ORDER — EPINEPHRINE 0.3 MG/0.3ML IJ SOAJ: 0.3000 mg | Freq: Once | INTRAMUSCULAR | 1 refills | Status: DC | PRN

## 2016-07-23 MED ORDER — EMTRICITABINE-TENOFOVIR DF 200-300 MG PO TABS: 1.0000 | ORAL_TABLET | Freq: Every day | ORAL | 1 refills | Status: DC

## 2016-07-23 MED ORDER — LISINOPRIL-HYDROCHLOROTHIAZIDE 20-25 MG PO TABS: 0.5000 | ORAL_TABLET | Freq: Every day | ORAL | 3 refills | Status: DC

## 2016-07-23 NOTE — Patient Instructions (Signed)
- Plan to take 1/2 tablet of the combo blood pressure medication (Lisinopril Hydrochlorothiazide for 1 week, if still having high readings, increase to the full pill.) - limit salt. DASH eating plan - return in 2 weeks for a nurse visit BP check with your log    Managing Your Hypertension Hypertension is commonly called high blood pressure. This is when the force of your blood pressing against the walls of your arteries is too strong. Arteries are blood vessels that carry blood from your heart throughout your body. Hypertension forces the heart to work harder to pump blood, and may cause the arteries to become narrow or stiff. Having untreated or uncontrolled hypertension can cause heart attack, stroke, kidney disease, and other problems. What are blood pressure readings? A blood pressure reading consists of a higher number over a lower number. Ideally, your blood pressure should be below 120/80. The first ("top") number is called the systolic pressure. It is a measure of the pressure in your arteries as your heart beats. The second ("bottom") number is called the diastolic pressure. It is a measure of the pressure in your arteries as the heart relaxes. What does my blood pressure reading mean? Blood pressure is classified into four stages. Based on your blood pressure reading, your health care provider may use the following stages to determine what type of treatment you need, if any. Systolic pressure and diastolic pressure are measured in a unit called mm Hg. Normal  Systolic pressure: below 124.  Diastolic pressure: below 80. Elevated  Systolic pressure: 580-998.  Diastolic pressure: below 80. Hypertension stage 1  Systolic pressure: 338-250.  Diastolic pressure: 53-97. Hypertension stage 2  Systolic pressure: 673 or above.  Diastolic pressure: 90 or above. What health risks are associated with hypertension? Managing your hypertension is an important responsibility. Uncontrolled  hypertension can lead to:  A heart attack.  A stroke.  A weakened blood vessel (aneurysm).  Heart failure.  Kidney damage.  Eye damage.  Metabolic syndrome.  Memory and concentration problems.  What changes can I make to manage my hypertension? Hypertension can be managed by making lifestyle changes and possibly by taking medicines. Your health care provider will help you make a plan to bring your blood pressure within a normal range. Eating and drinking  Eat a diet that is high in fiber and potassium, and low in salt (sodium), added sugar, and fat. An example eating plan is called the DASH (Dietary Approaches to Stop Hypertension) diet. To eat this way: ? Eat plenty of fresh fruits and vegetables. Try to fill half of your plate at each meal with fruits and vegetables. ? Eat whole grains, such as whole wheat pasta, brown rice, or whole grain bread. Fill about one quarter of your plate with whole grains. ? Eat low-fat diary products. ? Avoid fatty cuts of meat, processed or cured meats, and poultry with skin. Fill about one quarter of your plate with lean proteins such as fish, chicken without skin, beans, eggs, and tofu. ? Avoid premade and processed foods. These tend to be higher in sodium, added sugar, and fat.  Reduce your daily sodium intake. Most people with hypertension should eat less than 1,500 mg of sodium a day.  Limit alcohol intake to no more than 1 drink a day for nonpregnant women and 2 drinks a day for men. One drink equals 12 oz of beer, 5 oz of wine, or 1 oz of hard liquor. Lifestyle  Work with your health care provider  to maintain a healthy body weight, or to lose weight. Ask what an ideal weight is for you.  Get at least 30 minutes of exercise that causes your heart to beat faster (aerobic exercise) most days of the week. Activities may include walking, swimming, or biking.  Include exercise to strengthen your muscles (resistance exercise), such as weight  lifting, as part of your weekly exercise routine. Try to do these types of exercises for 30 minutes at least 3 days a week.  Do not use any products that contain nicotine or tobacco, such as cigarettes and e-cigarettes. If you need help quitting, ask your health care provider.  Control any long-term (chronic) conditions you have, such as high cholesterol or diabetes. Monitoring  Monitor your blood pressure at home as told by your health care provider. Your personal target blood pressure may vary depending on your medical conditions, your age, and other factors.  Have your blood pressure checked regularly, as often as told by your health care provider. Working with your health care provider  Review all the medicines you take with your health care provider because there may be side effects or interactions.  Talk with your health care provider about your diet, exercise habits, and other lifestyle factors that may be contributing to hypertension.  Visit your health care provider regularly. Your health care provider can help you create and adjust your plan for managing hypertension. Will I need medicine to control my blood pressure? Your health care provider may prescribe medicine if lifestyle changes are not enough to get your blood pressure under control, and if:  Your systolic blood pressure is 130 or higher.  Your diastolic blood pressure is 80 or higher.  Take medicines only as told by your health care provider. Follow the directions carefully. Blood pressure medicines must be taken as prescribed. The medicine does not work as well when you skip doses. Skipping doses also puts you at risk for problems. Contact a health care provider if:  You think you are having a reaction to medicines you have taken.  You have repeated (recurrent) headaches.  You feel dizzy.  You have swelling in your ankles.  You have trouble with your vision. Get help right away if:  You develop a severe  headache or confusion.  You have unusual weakness or numbness, or you feel faint.  You have severe pain in your chest or abdomen.  You vomit repeatedly.  You have trouble breathing. Summary  Hypertension is when the force of blood pumping through your arteries is too strong. If this condition is not controlled, it may put you at risk for serious complications.  Your personal target blood pressure may vary depending on your medical conditions, your age, and other factors. For most people, a normal blood pressure is less than 120/80.  Hypertension is managed by lifestyle changes, medicines, or both. Lifestyle changes include weight loss, eating a healthy, low-sodium diet, exercising more, and limiting alcohol. This information is not intended to replace advice given to you by your health care provider. Make sure you discuss any questions you have with your health care provider. Document Released: 10/20/2011 Document Revised: 12/24/2015 Document Reviewed: 12/24/2015 Elsevier Interactive Patient Education  [employer].

## 2016-07-23 NOTE — Progress Notes (Signed)
HPI:                                                                Jeanette [name] is a 46 y.o. female who presents to [facility] today for Truvada and HTN follow-up  HTN: taking Hydrochlorothiazide 25mg  daily. Compliant with medications. Checks BP's at home. BP range 140's-160's/80's-90's. Denies vision change, headache, chest pain with exertion, orthopnea, lightheadedness, syncope and edema.   Patient has been taking Truvada for PrEP for the last 3 months without difficulty. She is sexually active with female HIV positive partner. They mostly use condoms. She denies any fevers, chills, weight loss, or lymphadenopathy. She denies any adverse effects of the medication, including GI upset or headache. She does report nightsweats and mood lability that was present prior to starting PrEP.  Patient reports she has not had a menstrual cycle since approximately the first week of April. Prior to this, her menstrual cycle was normal. She states she has had unprotected intercourse approximately 5 times. She had an endometrial ablation approximately 1 year ago. Reports fatigue and lack of energy for the last two weeks.   Past Medical History:  Diagnosis Date  . Abnormal Pap smear of cervix   . Anemia   . Dysmenorrhea   . Hemorrhoids   . Hypertension   . Menorrhagia   . Pelvic adhesions   . Peripheral neuropathy 01/21/2016  . Spinal stenosis at L4-L5 level 03/10/2016  . Tubo-ovarian abscess 10/2015   Past Surgical History:  Procedure Laterality Date  . APPENDECTOMY    . ENDOMETRIAL ABLATION  08/16/2015  . MANDIBLE SURGERY    . OOPHORECTOMY Right 10/2015   for ovarian abscess   Social History  Substance Use Topics  . Smoking status: Former Smoker    Quit date: 03/24/2015  . Smokeless tobacco: Never Used  . Alcohol use Yes   family history includes Brain cancer in her brother; Diabetes in her brother, father, and mother; Heart attack in her  father, paternal grandfather, and paternal grandmother; Hyperlipidemia in her father; Hypertension in her father and son.  ROS: negative except as noted in the HPI  Medications: Current Outpatient Prescriptions  Medication Sig Dispense Refill  . emtricitabine-tenofovir (TRUVADA) 200-300 MG tablet Take 1 tablet by mouth daily. 30 tablet 1  . EPINEPHrine 0.3 mg/0.3 mL IJ SOAJ injection Inject 0.3 mLs (0.3 mg total) into the skin once as needed. 1 Device 1  . gabapentin (NEURONTIN) 300 MG capsule One tab PO BID for a week, then TID. May double weekly to a max of 3,600mg /day 180 capsule 3  . lisinopril-hydrochlorothiazide (PRINZIDE,ZESTORETIC) 20-25 MG tablet Take 0.5 tablets by mouth daily. 30 tablet 3   No current facility-administered medications for this visit.    Allergies  Allergen Reactions  . Other Anaphylaxis    Sunflower oils       Objective:  BP (!) 155/98   Pulse (!) 56   Wt 213 lb (96.6 kg)   LMP 05/18/2016   BMI 34.38 kg/m  Gen: well-groomed, cooperative, not ill-appearing, no distress HEENT: normal conjunctiva, EOM's intact, neck supple, trachea midline Pulm: Normal work of breathing, normal phonation, clear to auscultation bilaterally, no wheezes, rales or rhonchi CV: Normal rate, regular rhythm, s1 and  s2 distinct, no murmurs, clicks or rubs  Neuro: alert and oriented x 3,  no tremor MSK: moving all extremities, normal gait and station, no peripheral edema Lymph: no cervical or supraclavicular adenopathy Skin: warm, dry, intact; no rashes or lesions on exposed skin, no cyanosis   Results for orders placed or performed in visit on 07/23/16 (from the past 72 hour(s))  POCT urine pregnancy     Status: None   Collection Time: 07/23/16  8:28 AM  Result Value Ref Range   Preg Test, Ur Negative Negative   No results found.    Assessment and Plan: 46 y.o. female with   1. Missed period - POCT urine pregnancy negative - suspect patient is entering  perimenopause. She does endorse vasomotor flushing and mood lability. Patient educated that there is still a chance she could become pregnant if she has unprotected intercourse. Patient plans to use condoms - [facility] - Prolactin  2. Contact with and (suspected) exposure to human immunodeficiency virus (hiv) - HIV antibody nonreactive 07/16/16. Patient does not have symptoms of HIV. Patient has 1 sexual partner with an undetectable viral load. Declines other STI testing - normal renal function - continue Truvada - emtricitabine-tenofovir (TRUVADA) 200-300 MG tablet; Take 1 tablet by mouth daily.  Dispense: 30 tablet; Refill: 1 - follow-up in 3 months  3. Other long term (current) drug therapy - emtricitabine-tenofovir (TRUVADA) 200-300 MG tablet; Take 1 tablet by mouth daily.  Dispense: 30 tablet; Refill: 1  4. Essential hypertension - BP out of range - patient has been prescribed Lisinopril-HCTZ in the past, but has had issues with cost and compliance - instructed to take 1/2 tablet and monitor pressure at home. If pressures are >130/80 consistently, then take the full tablet - therapeutic lifestyle changes - lisinopril-hydrochlorothiazide (PRINZIDE,ZESTORETIC) 20-25 MG tablet; Take 0.5 tablets by mouth daily.  Dispense: 30 tablet; Refill: 3 - return in 2 weeks for nurse visit BP check  5. History of allergy to food additives - patient is allergic to sunflower seeds/oils and requesting a refill of her epipen - EPINEPHrine 0.3 mg/0.3 mL IJ SOAJ injection; Inject 0.3 mLs (0.3 mg total) into the skin once as needed.  Dispense: 1 Device; Refill: 1  Patient education and anticipatory guidance given Patient agrees with treatment plan Follow-up in 3 months or sooner as needed if symptoms worsen or fail to improve  Darlyne Russian PA-C

## 2016-08-06 ENCOUNTER — Ambulatory Visit: Payer: Self-pay

## 2016-08-23 ENCOUNTER — Other Ambulatory Visit: Payer: Self-pay

## 2016-08-23 DIAGNOSIS — Z206 Contact with and (suspected) exposure to human immunodeficiency virus [HIV]: Secondary | ICD-10-CM

## 2016-08-23 DIAGNOSIS — Z79899 Other long term (current) drug therapy: Secondary | ICD-10-CM

## 2016-08-23 MED ORDER — EMTRICITABINE-TENOFOVIR DF 200-300 MG PO TABS: 1.0000 | ORAL_TABLET | Freq: Every day | ORAL | 1 refills | Status: DC

## 2016-08-23 NOTE — Progress Notes (Signed)
Pt is aware she is to follow up in Sept.

## 2016-10-25 ENCOUNTER — Encounter: Payer: Self-pay | Admitting: Physician Assistant

## 2016-10-25 ENCOUNTER — Ambulatory Visit (INDEPENDENT_AMBULATORY_CARE_PROVIDER_SITE_OTHER): Payer: Self-pay | Admitting: Physician Assistant

## 2016-10-25 VITALS — BP 141/92 | HR 69 | Wt 212.0 lb

## 2016-10-25 DIAGNOSIS — E6609 Other obesity due to excess calories: Secondary | ICD-10-CM | POA: Insufficient documentation

## 2016-10-25 DIAGNOSIS — Z6834 Body mass index (BMI) 34.0-34.9, adult: Secondary | ICD-10-CM

## 2016-10-25 DIAGNOSIS — Z23 Encounter for immunization: Secondary | ICD-10-CM

## 2016-10-25 DIAGNOSIS — I10 Essential (primary) hypertension: Secondary | ICD-10-CM

## 2016-10-25 DIAGNOSIS — Z206 Contact with and (suspected) exposure to human immunodeficiency virus [HIV]: Secondary | ICD-10-CM

## 2016-10-25 DIAGNOSIS — Z79899 Other long term (current) drug therapy: Secondary | ICD-10-CM

## 2016-10-25 MED ORDER — AMLODIPINE BESYLATE 5 MG PO TABS: 5.0000 mg | ORAL_TABLET | Freq: Every day | ORAL | 5 refills | Status: DC

## 2016-10-25 MED ORDER — EMTRICITABINE-TENOFOVIR DF 200-300 MG PO TABS: 1.0000 | ORAL_TABLET | Freq: Every day | ORAL | 5 refills | Status: DC

## 2016-10-25 NOTE — Progress Notes (Signed)
HPI:                                                                Jeanette [name] is a 46 y.o. female who presents to [facility]: [facility] today for HTN and PrEP follow-up  HTN: taking Lisinopril-HCTZ daily. Compliant with medications. Checks BP's at home. BP range 140's-150's/80's-low 100's. Reports 2 weeks ago she was having dull, throbbing headaches without vision changes and BP's were elevated. Denies headache today and reports pressures have been on the lower end 140/80. Denies vision change, chest pain with exertion, orthopnea, lightheadedness, syncope and edema. Risk factors include: obesity, pos. family history  PrEP/Truvada: compliant with medication daily. One sexual partner, they mostly use condoms. Reports partner's viral load is undetectable.   Past Medical History:  Diagnosis Date  . Abnormal Pap smear of cervix   . Anemia   . Dysmenorrhea   . Hemorrhoids   . Hypertension   . Menorrhagia   . Pelvic adhesions   . Peripheral neuropathy 01/21/2016  . Spinal stenosis at L4-L5 level 03/10/2016  . Tubo-ovarian abscess 10/2015   Past Surgical History:  Procedure Laterality Date  . APPENDECTOMY    . ENDOMETRIAL ABLATION  08/16/2015  . MANDIBLE SURGERY    . OOPHORECTOMY Right 10/2015   for ovarian abscess   Social History  Substance Use Topics  . Smoking status: Former Smoker    Quit date: 03/24/2015  . Smokeless tobacco: Never Used  . Alcohol use Yes   family history includes Brain cancer in her brother; Diabetes in her brother, father, and mother; Heart attack in her father, paternal grandfather, and paternal grandmother; Hyperlipidemia in her father; Hypertension in her father and son.  ROS: negative except as noted in the HPI  Medications: Current Outpatient Prescriptions  Medication Sig Dispense Refill  . emtricitabine-tenofovir (TRUVADA) 200-300 MG tablet Take 1 tablet by mouth daily. 30 tablet 1  . gabapentin (NEURONTIN)  300 MG capsule One tab PO BID for a week, then TID. May double weekly to a max of 3,600mg /day 180 capsule 3  . lisinopril-hydrochlorothiazide (PRINZIDE,ZESTORETIC) 20-25 MG tablet Take 0.5 tablets by mouth daily. 30 tablet 3   No current facility-administered medications for this visit.    Allergies  Allergen Reactions  . Other Anaphylaxis    Sunflower oils    Objective:  BP (!) 141/92   Pulse 69   Wt 212 lb (96.2 kg)   BMI 34.22 kg/m  Gen:  alert, not ill-appearing, no distress, appropriate for age 46: head normocephalic without obvious abnormality, conjunctiva and cornea clear, trachea midline, no carotid bruit Pulm: Normal work of breathing, normal phonation, clear to auscultation bilaterally, no wheezes, rales or rhonchi CV: Normal rate, regular rhythm, s1 and s2 distinct, no murmurs, clicks or rubs Neuro: alert and oriented x 3, no tremor MSK: extremities atraumatic, normal gait and station, no peripheral edema, PT pulses intact Skin: intact, no rashes on exposed skin, no jaundice, no cyanosis  No results found for this or any previous visit (from the past 72 hour(s)). No results found.    Assessment and Plan: 46 y.o. female with   1. Other long term (current) drug therapy Lab Results  Component Value Date   CREATININE 0.60 07/16/2016  -  HIV antibody today - patient has no high risk sexual behaviors. Will extend follow-up visits to every 6 months - emtricitabine-tenofovir (TRUVADA) 200-300 MG tablet; Take 1 tablet by mouth daily.  Dispense: 30 tablet; Refill: 5   2. Contact with and (suspected) exposure to human immunodeficiency virus (hiv) - emtricitabine-tenofovir (TRUVADA) 200-300 MG tablet; Take 1 tablet by mouth daily.  Dispense: 30 tablet; Refill: 5 - HIV antibody  3. Need for immunization against influenza - Flu Vaccine QUAD 36+ mos IM  4. Essential hypertension BP Readings from Last 3 Encounters:  10/25/16 (!) 141/92  07/23/16 (!) 155/98  05/14/16  122/80  - BP out of range in office and at home. Goal <130/80 - adding Amlodipine - cont current meds - therapeutic lifestyle changes - amLODipine (NORVASC) 5 MG tablet; Take 1 tablet (5 mg total) by mouth daily.  Dispense: 30 tablet; Refill: 5   Patient education and anticipatory guidance given Patient agrees with treatment plan Follow-up in 2 weeks for nurse visit BP check, then every 6 months for medication management or sooner as needed if symptoms worsen or fail to improve  Darlyne Russian PA-C

## 2016-10-25 NOTE — Patient Instructions (Addendum)
For your blood pressure: - Start Amlodipine daily - Continue your Lisinopril-HCTZ - Check blood pressure at home for the next 2 weeks - Check around the same time each day in a relaxed setting - Limit salt to <1500 mg/day - Follow DASH eating plan - limit alcohol to 2 standard drinks per day - avoid tobacco products - weight loss: 7% of current body weight - Follow-up in 2 weeks   DASH Eating Plan DASH stands for "Dietary Approaches to Stop Hypertension." The DASH eating plan is a healthy eating plan that has been shown to reduce high blood pressure (hypertension). It may also reduce your risk for type 2 diabetes, heart disease, and stroke. The DASH eating plan may also help with weight loss. What are tips for following this plan? General guidelines  Avoid eating more than 2,300 mg (milligrams) of salt (sodium) a day. If you have hypertension, you may need to reduce your sodium intake to 1,500 mg a day.  Limit alcohol intake to no more than 1 drink a day for nonpregnant women and 2 drinks a day for men. One drink equals 12 oz of beer, 5 oz of wine, or 1 oz of hard liquor.  Work with your health care provider to maintain a healthy body weight or to lose weight. Ask what an ideal weight is for you.  Get at least 30 minutes of exercise that causes your heart to beat faster (aerobic exercise) most days of the week. Activities may include walking, swimming, or biking.  Work with your health care provider or diet and nutrition specialist (dietitian) to adjust your eating plan to your individual calorie needs. Reading food labels  Check food labels for the amount of sodium per serving. Choose foods with less than 5 percent of the Daily Value of sodium. Generally, foods with less than 300 mg of sodium per serving fit into this eating plan.  To find whole grains, look for the word "whole" as the first word in the ingredient list. Shopping  Buy products labeled as "low-sodium" or "no salt  added."  Buy fresh foods. Avoid canned foods and premade or frozen meals. Cooking  Avoid adding salt when cooking. Use salt-free seasonings or herbs instead of table salt or sea salt. Check with your health care provider or pharmacist before using salt substitutes.  Do not fry foods. Cook foods using healthy methods such as baking, boiling, grilling, and broiling instead.  Cook with heart-healthy oils, such as olive, canola, soybean, or sunflower oil. Meal planning   Eat a balanced diet that includes: ? 5 or more servings of fruits and vegetables each day. At each meal, try to fill half of your plate with fruits and vegetables. ? Up to 6-8 servings of whole grains each day. ? Less than 6 oz of lean meat, poultry, or fish each day. A 3-oz serving of meat is about the same size as a deck of cards. One egg equals 1 oz. ? 2 servings of low-fat dairy each day. ? A serving of nuts, seeds, or beans 5 times each week. ? Heart-healthy fats. Healthy fats called Omega-3 fatty acids are found in foods such as flaxseeds and coldwater fish, like sardines, salmon, and mackerel.  Limit how much you eat of the following: ? Canned or prepackaged foods. ? Food that is high in trans fat, such as fried foods. ? Food that is high in saturated fat, such as fatty meat. ? Sweets, desserts, sugary drinks, and other foods with added  sugar. ? Full-fat dairy products.  Do not salt foods before eating.  Try to eat at least 2 vegetarian meals each week.  Eat more home-cooked food and less restaurant, buffet, and fast food.  When eating at a restaurant, ask that your food be prepared with less salt or no salt, if possible. What foods are recommended? The items listed may not be a complete list. Talk with your dietitian about what dietary choices are best for you. Grains Whole-grain or whole-wheat bread. Whole-grain or whole-wheat pasta. Brown rice. [place]row. Bulgur. Whole-grain and low-sodium cereals.  Pita bread. Low-fat, low-sodium crackers. Whole-wheat flour tortillas. Vegetables Fresh or frozen vegetables (raw, steamed, roasted, or grilled). Low-sodium or reduced-sodium tomato and vegetable juice. Low-sodium or reduced-sodium tomato sauce and tomato paste. Low-sodium or reduced-sodium canned vegetables. Fruits All fresh, dried, or frozen fruit. Canned fruit in natural juice (without added sugar). Meat and other protein foods Skinless chicken or [place]. Ground chicken or [place]. Pork with fat trimmed off. Fish and seafood. Egg whites. Dried beans, peas, or lentils. Unsalted nuts, nut butters, and seeds. Unsalted canned beans. Lean cuts of beef with fat trimmed off. Low-sodium, lean deli meat. Dairy Low-fat (1%) or fat-free (skim) milk. Fat-free, low-fat, or reduced-fat cheeses. Nonfat, low-sodium ricotta or cottage cheese. Low-fat or nonfat yogurt. Low-fat, low-sodium cheese. Fats and oils Soft margarine without trans fats. Vegetable oil. Low-fat, reduced-fat, or light mayonnaise and salad dressings (reduced-sodium). Canola, safflower, olive, soybean, and sunflower oils. Avocado. Seasoning and other foods Herbs. Spices. Seasoning mixes without salt. Unsalted popcorn and pretzels. Fat-free sweets. What foods are not recommended? The items listed may not be a complete list. Talk with your dietitian about what dietary choices are best for you. Grains Baked goods made with fat, such as croissants, muffins, or some breads. Dry pasta or rice meal packs. Vegetables Creamed or fried vegetables. Vegetables in a cheese sauce. Regular canned vegetables (not low-sodium or reduced-sodium). Regular canned tomato sauce and paste (not low-sodium or reduced-sodium). Regular tomato and vegetable juice (not low-sodium or reduced-sodium). Angie Fava. Olives. Fruits Canned fruit in a light or heavy syrup. Fried fruit. Fruit in cream or butter sauce. Meat and other protein foods Fatty cuts of meat. Ribs. Fried  meat. Berniece Salines. Sausage. Bologna and other processed lunch meats. Salami. Fatback. Hotdogs. Bratwurst. Salted nuts and seeds. Canned beans with added salt. Canned or smoked fish. Whole eggs or egg yolks. Chicken or [place] with skin. Dairy Whole or 2% milk, cream, and half-and-half. Whole or full-fat cream cheese. Whole-fat or sweetened yogurt. Full-fat cheese. Nondairy creamers. Whipped toppings. Processed cheese and cheese spreads. Fats and oils Butter. Stick margarine. Lard. Shortening. Ghee. Bacon fat. Tropical oils, such as coconut, palm kernel, or palm oil. Seasoning and other foods Salted popcorn and pretzels. Onion salt, garlic salt, seasoned salt, table salt, and sea salt. Worcestershire sauce. Tartar sauce. Barbecue sauce. Teriyaki sauce. Soy sauce, including reduced-sodium. Steak sauce. Canned and packaged gravies. Fish sauce. Oyster sauce. Cocktail sauce. Horseradish that you find on the shelf. Ketchup. Mustard. Meat flavorings and tenderizers. Bouillon cubes. Hot sauce and Tabasco sauce. Premade or packaged marinades. Premade or packaged taco seasonings. Relishes. Regular salad dressings. Where to find more information:  National Heart, Lung, and [place]: https://wilson-eaton.com/  American Heart Association: www.heart.org Summary  The DASH eating plan is a healthy eating plan that has been shown to reduce high blood pressure (hypertension). It may also reduce your risk for type 2 diabetes, heart disease, and stroke.  With the DASH eating  plan, you should limit salt (sodium) intake to 2,300 mg a day. If you have hypertension, you may need to reduce your sodium intake to 1,500 mg a day.  When on the DASH eating plan, aim to eat more fresh fruits and vegetables, whole grains, lean proteins, low-fat dairy, and heart-healthy fats.  Work with your health care provider or diet and nutrition specialist (dietitian) to adjust your eating plan to your individual calorie needs. This information is  not intended to replace advice given to you by your health care provider. Make sure you discuss any questions you have with your health care provider. Document Released: 01/14/2011 Document Revised: 01/19/2016 Document Reviewed: 01/19/2016 Elsevier Interactive Patient Education  2017 [employer].

## 2016-10-26 ENCOUNTER — Encounter: Payer: Self-pay | Admitting: Physician Assistant

## 2016-10-26 DIAGNOSIS — R739 Hyperglycemia, unspecified: Secondary | ICD-10-CM | POA: Insufficient documentation

## 2016-10-26 LAB — HIV ANTIBODY (ROUTINE TESTING W REFLEX): HIV: NONREACTIVE

## 2016-10-26 NOTE — Progress Notes (Signed)
HIV test negative Continue Truvada and follow-up in 6 months Recommend returning for fasting metabolic panel and J4H test when she is financially able

## 2016-11-04 ENCOUNTER — Ambulatory Visit: Payer: Self-pay

## 2017-02-17 ENCOUNTER — Telehealth: Payer: Self-pay | Admitting: Physician Assistant

## 2017-02-17 NOTE — Telephone Encounter (Signed)
Pt has appt tomorrow in office.

## 2017-02-17 NOTE — Telephone Encounter (Signed)
Pt called clinic complaining of chest pain and arm pain. Pt reports the pain is located in the center of her chest, and her arms only hurt if she lifts them up above her head. Denies any shortness of breath or jaw pain. 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 Advised if her BP is elevated, if the pain gets worse, and if she develops any shortness of breath she needs to seek emergency treatment-  Verbalized understanding.

## 2017-02-18 ENCOUNTER — Ambulatory Visit (INDEPENDENT_AMBULATORY_CARE_PROVIDER_SITE_OTHER): Payer: Self-pay | Admitting: Physician Assistant

## 2017-02-18 ENCOUNTER — Encounter: Payer: Self-pay | Admitting: Physician Assistant

## 2017-02-18 VITALS — BP 116/78 | HR 58 | Temp 98.2°F | Wt 224.0 lb

## 2017-02-18 DIAGNOSIS — K5900 Constipation, unspecified: Secondary | ICD-10-CM

## 2017-02-18 DIAGNOSIS — G8929 Other chronic pain: Secondary | ICD-10-CM

## 2017-02-18 DIAGNOSIS — R1013 Epigastric pain: Secondary | ICD-10-CM

## 2017-02-18 DIAGNOSIS — M5441 Lumbago with sciatica, right side: Secondary | ICD-10-CM

## 2017-02-18 DIAGNOSIS — R0789 Other chest pain: Secondary | ICD-10-CM

## 2017-02-18 MED ORDER — SENNOSIDES-DOCUSATE SODIUM 8.6-50 MG PO TABS: 2.0000 | ORAL_TABLET | Freq: Two times a day (BID) | ORAL | 0 refills | Status: DC

## 2017-02-18 MED ORDER — TOPIRAMATE 25 MG PO TABS: ORAL_TABLET | ORAL | 1 refills | Status: DC

## 2017-02-18 MED ORDER — ESOMEPRAZOLE MAGNESIUM 40 MG PO CPDR: 40.0000 mg | DELAYED_RELEASE_CAPSULE | Freq: Every day | ORAL | 1 refills | Status: DC

## 2017-02-18 NOTE — Progress Notes (Signed)
HPI:                                                                Jeanette [name] is a 47 y.o. female who presents to [facility] today for chest pain and constipation  Pleasant 47 yo F with PMH of anemia, HTN, spinal stenosis, obesity and hemorrhoids presents today complaining of constipation beginning 3 days ago. Has tried Colace and increased water intake, but has still not had a bowel movement. She also reports sharp, retrosternal chest pain like "being punched" that began yesterday. Pain is worse with deep inspiration and laying on her stomach. Pain is constant, gradually improving. Reports she has been belching more than usual, endorses early satiety, intermittent nausea, and a dry cough. Reports history of "heart burn," does not take any medication for it. Denies weight loss, dysphagia, forceful vomiting, hematochezia and melena.    Past Medical History:  Diagnosis Date  . Abnormal Pap smear of cervix   . Anemia   . Dysmenorrhea   . Hemorrhoids   . Hypertension   . Menorrhagia   . Pelvic adhesions   . Peripheral neuropathy 01/21/2016  . Spinal stenosis at L4-L5 level 03/10/2016  . Tubo-ovarian abscess 10/2015   Past Surgical History:  Procedure Laterality Date  . APPENDECTOMY    . ENDOMETRIAL ABLATION  08/16/2015  . MANDIBLE SURGERY    . OOPHORECTOMY Right 10/2015   for ovarian abscess   Social History   Tobacco Use  . Smoking status: Former Smoker    Last attempt to quit: 03/24/2015    Years since quitting: 1.9  . Smokeless tobacco: Never Used  Substance Use Topics  . Alcohol use: Yes   family history includes Brain cancer in her brother; Diabetes in her brother, father, maternal grandmother, and mother; Heart attack in her father, paternal grandfather, and paternal grandmother; Hyperlipidemia in her father; Hypertension in her father and son; Stroke in her mother.  ROS: negative except as noted in the  HPI  Medications: Current Outpatient Medications  Medication Sig Dispense Refill  . emtricitabine-tenofovir (TRUVADA) 200-300 MG tablet Take 1 tablet by mouth daily. 30 tablet 5  . gabapentin (NEURONTIN) 300 MG capsule One tab PO BID for a week, then TID. May double weekly to a max of 3,600mg /day 180 capsule 3  . lisinopril-hydrochlorothiazide (PRINZIDE,ZESTORETIC) 20-25 MG tablet Take 0.5 tablets by mouth daily. 30 tablet 3   No current facility-administered medications for this visit.    Allergies  Allergen Reactions  . Other Anaphylaxis    Sunflower oils       Objective:  BP 116/78   Pulse (!) 58   Temp 98.2 F (36.8 C) (Oral)   Wt 224 lb (101.6 kg)   SpO2 96%   BMI 36.15 kg/m  Gen:  alert, not ill-appearing, no distress, appropriate for age, obese female HEENT: head normocephalic without obvious abnormality, conjunctiva and cornea clear, trachea midline Pulm: Normal work of breathing, normal phonation, clear to auscultation bilaterally, no wheezes, rales or rhonchi CV: Normal rate, regular rhythm, s1 and s2 distinct, no murmurs, clicks or rubs  GI: abdomen soft, mildly distended, no ascites, mildly tender in the bilateral lower quadrants, no rebound or guarding Neuro: alert and oriented x 3,  no tremor MSK: extremities atraumatic, antalgic gait and station Skin: intact, no rashes on exposed skin, no jaundice, no cyanosis Psych: well-groomed, cooperative, good eye contact, euthymic mood, affect mood-congruent, speech is articulate, and thought processes clear and goal-directed  No results found for this or any previous visit (from the past 72 hour(s)). No results found.  ECG 02/18/2017 9:00 Vent Rate 58 bpm PR-I 160 ms QRS 90 ms QT/QTc 446/437 Sinus bradycardia  Assessment and Plan: 47 y.o. female with   1. Atypical chest pain - EKG 12-Lead sinus brady, no dysrhythmia or conduction abnormality, no ST or T wave abnormalities  - chest pain is non-exertional,  non-reproducible, and most consistent with GERD/dyspepsia  2. Constipation, unspecified constipation type - Senokot bid until stooling normally - then continue daily Colace, high fiber diet, and water. Unfortunately patient is self-pay and will not be able to afford Amitiza or Linzess - senna-docusate (SENOKOT-S) 8.6-50 MG tablet; Take 2 tablets by mouth 2 (two) times daily. Until stooling regularly  Dispense: 60 tablet; Refill: 0  3. Dyspepsia - no red flag symptoms, 4 week trial of PPI and lifestyle therapy - esomeprazole (NEXIUM) 40 MG capsule; Take 1 capsule (40 mg total) by mouth daily before breakfast. Please apply GoodRx bin  Dispense: 30 capsule; Refill: 1  4. Chronic right-sided low back pain with right-sided sciatica - discontinuing Gabapentin, due to patient's concern about increased irritability. Switching to Topamax, self-titrate to 50mg  bid over the next 3 weeks - topiramate (TOPAMAX) 25 MG tablet; One tab by mouth at bedtime for 1 week, then one tab by mouth twice a day for 1 week, then two tabs by mouth twice a day  Dispense: 90 tablet; Refill: 1   Patient education and anticipatory guidance given Patient agrees with treatment plan Follow-up in 4 weeks or sooner as needed if symptoms worsen or fail to improve  Darlyne Russian PA-C

## 2017-02-18 NOTE — Patient Instructions (Addendum)
For constipation: - senna-docusate (stimulant laxative and stool softener) twice daily until stool normally - stay on docusate (stool softener) daily - high fiber diet - drink at least 64 ounces of water per day - avoid gas producing foods (beans, bagels, pretzels, coffee)  For abdominal pain: - start Esomeprazole (generic Nexium) every morning before breakfast - limit use of Ibuprofen and other OTC pain relievers - avoid taking medications on an empty stomach - avoid alcohol   Constipation, Adult Constipation is when a person has fewer bowel movements in a week than normal, has difficulty having a bowel movement, or has stools that are dry, hard, or larger than normal. Constipation may be caused by an underlying condition. It may become worse with age if a person takes certain medicines and does not take in enough fluids. Follow these instructions at home: Eating and drinking   Eat foods that have a lot of fiber, such as fresh fruits and vegetables, whole grains, and beans.  Limit foods that are high in fat, low in fiber, or overly processed, such as french fries, hamburgers, cookies, candies, and soda.  Drink enough fluid to keep your urine clear or pale yellow. General instructions  Exercise regularly or as told by your health care provider.  Go to the restroom when you have the urge to go. Do not hold it in.  Take over-the-counter and prescription medicines only as told by your health care provider. These include any fiber supplements.  Practice pelvic floor retraining exercises, such as deep breathing while relaxing the lower abdomen and pelvic floor relaxation during bowel movements.  Watch your condition for any changes.  Keep all follow-up visits as told by your health care provider. This is important. Contact a health care provider if:  You have pain that gets worse.  You have a fever.  You do not have a bowel movement after 4 days.  You vomit.  You are not  hungry.  You lose weight.  You are bleeding from the anus.  You have thin, pencil-like stools. Get help right away if:  You have a fever and your symptoms suddenly get worse.  You leak stool or have blood in your stool.  Your abdomen is bloated.  You have severe pain in your abdomen.  You feel dizzy or you faint. This information is not intended to replace advice given to you by your health care provider. Make sure you discuss any questions you have with your health care provider. Document Released: 10/24/2003 Document Revised: 08/15/2015 Document Reviewed: 07/16/2015 Elsevier Interactive Patient Education  2018 [employer].

## 2017-03-02 ENCOUNTER — Telehealth: Payer: Self-pay

## 2017-03-02 NOTE — Telephone Encounter (Signed)
It is time for Jeanette [name] to reapply for patient assistants for medication Truvada.  She is requesting a new application. Please advise. -EH/RMA

## 2017-03-02 NOTE — Telephone Encounter (Signed)
Called patient - advised of provider's notations. Patient states she understands - will come in tomorrow, Thursday, 03/02/17 between 9 am-10 am to complete her part and bring proof of income and [place] residency for our office to fax for her.

## 2017-03-02 NOTE — Telephone Encounter (Signed)
She will need to complete the Advancing Access Enrollment form I have filled in the provider information Once she completes, we can fax it for her She will need proof of income and of [place] residency

## 2017-03-18 ENCOUNTER — Ambulatory Visit: Payer: Self-pay | Admitting: Physician Assistant

## 2017-03-18 DIAGNOSIS — Z0189 Encounter for other specified special examinations: Secondary | ICD-10-CM

## 2017-05-19 ENCOUNTER — Ambulatory Visit: Payer: Self-pay | Admitting: Physician Assistant

## 2017-05-19 DIAGNOSIS — Z0189 Encounter for other specified special examinations: Secondary | ICD-10-CM

## 2017-06-01 ENCOUNTER — Encounter: Payer: Self-pay | Admitting: Physician Assistant

## 2017-06-01 ENCOUNTER — Ambulatory Visit (INDEPENDENT_AMBULATORY_CARE_PROVIDER_SITE_OTHER): Payer: Self-pay | Admitting: Physician Assistant

## 2017-06-01 VITALS — BP 143/87 | HR 60 | Wt 224.0 lb

## 2017-06-01 DIAGNOSIS — J302 Other seasonal allergic rhinitis: Secondary | ICD-10-CM

## 2017-06-01 DIAGNOSIS — Z206 Contact with and (suspected) exposure to human immunodeficiency virus [HIV]: Secondary | ICD-10-CM

## 2017-06-01 DIAGNOSIS — Z79899 Other long term (current) drug therapy: Secondary | ICD-10-CM

## 2017-06-01 DIAGNOSIS — Z5181 Encounter for therapeutic drug level monitoring: Secondary | ICD-10-CM

## 2017-06-01 DIAGNOSIS — Z9189 Other specified personal risk factors, not elsewhere classified: Secondary | ICD-10-CM

## 2017-06-01 DIAGNOSIS — M25562 Pain in left knee: Secondary | ICD-10-CM

## 2017-06-01 DIAGNOSIS — M5416 Radiculopathy, lumbar region: Secondary | ICD-10-CM

## 2017-06-01 MED ORDER — GABAPENTIN 300 MG PO CAPS: 300.0000 mg | ORAL_CAPSULE | Freq: Three times a day (TID) | ORAL | 3 refills | Status: DC

## 2017-06-01 MED ORDER — FLUTICASONE PROPIONATE 50 MCG/ACT NA SUSP: 1.0000 | Freq: Every day | NASAL | 3 refills | Status: DC

## 2017-06-01 MED ORDER — CETIRIZINE HCL 10 MG PO TABS: 10.0000 mg | ORAL_TABLET | Freq: Every day | ORAL | 3 refills | Status: DC

## 2017-06-01 MED ORDER — CELECOXIB 100 MG PO CAPS: ORAL_CAPSULE | ORAL | 5 refills | Status: DC

## 2017-06-01 MED ORDER — OLOPATADINE HCL 0.1 % OP SOLN: 1.0000 [drp] | Freq: Two times a day (BID) | OPHTHALMIC | 11 refills | Status: DC

## 2017-06-01 NOTE — Progress Notes (Signed)
HPI:                                                                Jeanette [name] is a 47 y.o. female who presents to [place]: [place] today for medication monitoring  Due for her Q31month truvada labs. No concerns. No new partners.  Additional concerns include:   Left knee pain: reports sudden onset anterior lateral left knee pain, gradually worsening for approx 2 months. No known injury or trauma. "Feels bruised and cracks." Has tried topical muscle rub and OTC knee brace without improvement.  Allergies: reports itchy, watery eyes, rhinorrhea and sneezing. Denies coughing or wheezing.  She is also requesting to change her medication for her lumbar radiculitis. Topamax did not help pain and caused excessive drowsiness. She wants to go back on Gabapentin   Depression screen [facility] 2/9 10/25/2016  Decreased Interest 0  Down, Depressed, Hopeless 1  PHQ - 2 Score 1    No flowsheet data found.    Past Medical History:  Diagnosis Date  . Abnormal Pap smear of cervix   . Anemia   . Dysmenorrhea   . Hemorrhoids   . Hypertension   . Menorrhagia   . Pelvic adhesions   . Peripheral neuropathy 01/21/2016  . Spinal stenosis at L4-L5 level 03/10/2016  . Tubo-ovarian abscess 10/2015   Past Surgical History:  Procedure Laterality Date  . APPENDECTOMY    . ENDOMETRIAL ABLATION  08/16/2015  . MANDIBLE SURGERY    . OOPHORECTOMY Right 10/2015   for ovarian abscess   Social History   Tobacco Use  . Smoking status: Former Smoker    Last attempt to quit: 03/24/2015    Years since quitting: 2.1  . Smokeless tobacco: Never Used  Substance Use Topics  . Alcohol use: Yes   family history includes Brain cancer in her brother; Diabetes in her brother, father, maternal grandmother, and mother; Heart attack in her father, paternal grandfather, and paternal grandmother; Hyperlipidemia in her father; Hypertension in her father and son; Stroke in her  mother.    ROS: negative except as noted in the HPI  Medications: Current Outpatient Medications  Medication Sig Dispense Refill  . emtricitabine-tenofovir (TRUVADA) 200-300 MG tablet Take 1 tablet by mouth daily. 30 tablet 5  . esomeprazole (NEXIUM) 40 MG capsule Take 1 capsule (40 mg total) by mouth daily before breakfast. Please apply GoodRx bin 30 capsule 1  . ibuprofen (ADVIL,MOTRIN) 600 MG tablet Take 600 mg by mouth every 6 (six) hours as needed.    Marland Kitchen lisinopril-hydrochlorothiazide (PRINZIDE,ZESTORETIC) 20-25 MG tablet Take 0.5 tablets by mouth daily. 30 tablet 3  . senna-docusate (SENOKOT-S) 8.6-50 MG tablet Take 2 tablets by mouth 2 (two) times daily. Until stooling regularly 60 tablet 0  . topiramate (TOPAMAX) 25 MG tablet One tab by mouth at bedtime for 1 week, then one tab by mouth twice a day for 1 week, then two tabs by mouth twice a day 90 tablet 1   No current facility-administered medications for this visit.    Allergies  Allergen Reactions  . Other Anaphylaxis    Sunflower oils       Objective:  BP (!) 143/87   Pulse 60   Wt 224 lb (101.6  kg)   BMI 36.15 kg/m  Gen:  alert, not ill-appearing, no distress, appropriate for age, obese female HEENT: head normocephalic without obvious abnormality, conjunctiva and cornea clear, trachea midline Pulm: Normal work of breathing, normal phonation, clear to auscultation bilaterally, no wheezes, rales or rhonchi CV: Normal rate, regular rhythm, s1 and s2 distinct, no murmurs, clicks or rubs  Neuro: alert and oriented x 3, no tremor MSK: extremities atraumatic, normal gait and station Left knee: atraumatic, no edema or effusion, there is crepitus, lateral joint line tenderness, ROM intact Skin: intact, no rashes on exposed skin, no jaundice, no cyanosis Psych: well-groomed, cooperative, good eye contact, euthymic mood, affect mood-congruent, speech is articulate, and thought processes clear and goal-directed    No  results found for this or any previous visit (from the past 72 hour(s)). No results found.    Assessment and Plan: 47 y.o. female with   1. Medication monitoring encounter - Basic metabolic panel - HIV antibody - no add'l Truvada refills until labs are resulted  2. Acute pain of left knee - benign exam, differential includes OA, IT band syndrome, and meniscal tear. Recommend 4 weeks of conservative management with NSAID, RICE protocol. Follow-up with Sports Med if no improvement   Medication monitoring encounter - Plan: Basic metabolic panel, HIV antibody  Acute pain of left knee  Lumbar radicular pain - Plan: gabapentin (NEURONTIN) 300 MG capsule, celecoxib (CELEBREX) 100 MG capsule  At risk for HIV due to heterosexual contact - Plan: HIV antibody  Seasonal allergic rhinitis, unspecified trigger - Plan: olopatadine (PATANOL) 0.1 % ophthalmic solution, cetirizine (ZYRTEC) 10 MG tablet, fluticasone (FLONASE) 50 MCG/ACT nasal spray   Patient education and anticipatory guidance given Patient agrees with treatment plan Follow-up in 6 months or sooner as needed if symptoms worsen or fail to improve  Darlyne Russian PA-C

## 2017-06-01 NOTE — Patient Instructions (Signed)

## 2017-06-02 DIAGNOSIS — J302 Other seasonal allergic rhinitis: Secondary | ICD-10-CM | POA: Insufficient documentation

## 2017-06-21 LAB — HIV ANTIBODY (ROUTINE TESTING W REFLEX): HIV 1&2 Ab, 4th Generation: NONREACTIVE

## 2017-06-21 MED ORDER — EMTRICITABINE-TENOFOVIR DF 200-300 MG PO TABS: 1.0000 | ORAL_TABLET | Freq: Every day | ORAL | 2 refills | Status: DC

## 2017-06-21 NOTE — Addendum Note (Signed)
Addended by: Nelson Chimes E on: 06/21/2017 10:19 PM   Modules accepted: Orders

## 2017-06-22 NOTE — Progress Notes (Signed)
Per lab, patient declined BMP When she started PrEP she agreed to regular serum monitoring every 3 months Her renal function needs to be monitored for her own safety For example, Celebrex combined with PrEP can cause kidney injury If she cannot be compliant with this, then we will not be able to prescribe PrEP for her  She needs to return for the metabolic panel asap

## 2017-09-29 ENCOUNTER — Encounter: Payer: Self-pay | Admitting: Physician Assistant

## 2017-09-29 ENCOUNTER — Ambulatory Visit (INDEPENDENT_AMBULATORY_CARE_PROVIDER_SITE_OTHER): Payer: Self-pay | Admitting: Physician Assistant

## 2017-09-29 ENCOUNTER — Ambulatory Visit: Payer: Self-pay | Admitting: Physician Assistant

## 2017-09-29 VITALS — BP 132/85 | HR 80 | Wt 224.0 lb

## 2017-09-29 DIAGNOSIS — M5416 Radiculopathy, lumbar region: Secondary | ICD-10-CM

## 2017-09-29 DIAGNOSIS — Z79899 Other long term (current) drug therapy: Secondary | ICD-10-CM

## 2017-09-29 DIAGNOSIS — Z206 Contact with and (suspected) exposure to human immunodeficiency virus [HIV]: Secondary | ICD-10-CM

## 2017-09-29 DIAGNOSIS — Z23 Encounter for immunization: Secondary | ICD-10-CM

## 2017-09-29 MED ORDER — GABAPENTIN 300 MG PO CAPS: 300.0000 mg | ORAL_CAPSULE | Freq: Every day | ORAL | 3 refills | Status: DC

## 2017-09-29 MED ORDER — EMTRICITABINE-TENOFOVIR DF 200-300 MG PO TABS: 1.0000 | ORAL_TABLET | Freq: Every day | ORAL | 2 refills | Status: DC

## 2017-09-29 NOTE — Progress Notes (Signed)
HPI:                                                                Jeanette [name] is a 47 y.o. female who presents to [place]: [place] today for medication management  She has been on Truvada for PrEP for over a year. Last HIV antibody 3 months ago negative. Denies any adverse effects. Still with same HIV positive partner, undetectable levels. No history of STI or high risk sexual behaviors. Here for labs today.  She also reports that her right leg pain and numbness has been gradually worsening for the last 6 months or so. She is working full-time as a Geneticist, molecular role and reports by mid-day she is limping, dragging her leg and tripping. Currently only takes Gabapentin 300 mg at bedtime because she does not want to take any medication that could interfere with her job.    No flowsheet data found.    Past Medical History:  Diagnosis Date  . Abnormal Pap smear of cervix   . Anemia   . Dysmenorrhea   . Hemorrhoids   . Hypertension   . Menorrhagia   . Pelvic adhesions   . Peripheral neuropathy 01/21/2016  . Spinal stenosis at L4-L5 level 03/10/2016  . Tubo-ovarian abscess 10/2015   Past Surgical History:  Procedure Laterality Date  . APPENDECTOMY    . ENDOMETRIAL ABLATION  08/16/2015  . MANDIBLE SURGERY    . OOPHORECTOMY Right 10/2015   for ovarian abscess   Social History   Tobacco Use  . Smoking status: Former Smoker    Last attempt to quit: 03/24/2015    Years since quitting: 2.5  . Smokeless tobacco: Never Used  Substance Use Topics  . Alcohol use: Yes   family history includes Brain cancer in her brother; Diabetes in her brother, father, maternal grandmother, and mother; Heart attack in her father, paternal grandfather, and paternal grandmother; Hyperlipidemia in her father; Hypertension in her father and son; Stroke in her mother.    ROS: negative except as noted in the HPI  Medications: Current Outpatient Medications   Medication Sig Dispense Refill  . emtricitabine-tenofovir (TRUVADA) 200-300 MG tablet Take 1 tablet by mouth daily. 30 tablet 2  . gabapentin (NEURONTIN) 300 MG capsule Take 1-2 capsules (300-600 mg total) by mouth at bedtime. 90 capsule 3  . lisinopril-hydrochlorothiazide (PRINZIDE,ZESTORETIC) 20-25 MG tablet Take 0.5 tablets by mouth daily. 30 tablet 3   No current facility-administered medications for this visit.    Allergies  Allergen Reactions  . Other Anaphylaxis    Sunflower oils       Objective:  BP 132/85   Pulse 80   Wt 224 lb (101.6 kg)   BMI 36.15 kg/m  Gen:  alert, not ill-appearing, no distress, appropriate for age, obese female HEENT: head normocephalic without obvious abnormality, conjunctiva and cornea clear, trachea midline Pulm: Normal work of breathing, normal phonation, clear to auscultation bilaterally, no wheezes, rales or rhonchi CV: Normal rate, regular rhythm, s1 and s2 distinct, no murmurs, clicks or rubs  Neuro: alert and oriented x 3, no tremor MSK: extremities atraumatic, antalgic gait Skin: intact, no rashes on exposed skin, no jaundice, no cyanosis Psych: well-groomed, cooperative, good eye contact, euthymic mood, affect mood-congruent,  speech is articulate, and thought processes clear and goal-directed    No results found for this or any previous visit (from the past 72 hour(s)). No results found.    Assessment and Plan: 47 y.o. female with   .[name] was seen today for follow-up.  Diagnoses and all orders for this visit:  Other long term (current) drug therapy -     emtricitabine-tenofovir (TRUVADA) 200-300 MG tablet; Take 1 tablet by mouth daily. -     HIV antibody (with reflex) -     COMPLETE METABOLIC PANEL WITH GFR  Lumbar radicular pain -     gabapentin (NEURONTIN) 300 MG capsule; Take 1-2 capsules (300-600 mg total) by mouth at bedtime.  Contact with and (suspected) exposure to human immunodeficiency virus (hiv) -      emtricitabine-tenofovir (TRUVADA) 200-300 MG tablet; Take 1 tablet by mouth daily. -     HIV antibody (with reflex)  Need for immunization against influenza -     Flu Vaccine QUAD 36+ mos IM    Lumbar radicular pain - patient has severe spinal stenosis. She is currently uninsured and has been unable to afford neurosurgery office co-pay - refilled Gabapentin - encouraged to keep f/u with sports medicine  - encouraged to contact her HR department regarding FMLA   Patient education and anticipatory guidance given Patient agrees with treatment plan Follow-up in 3 months for medication management or sooner as needed if symptoms worsen or fail to improve  Darlyne Russian PA-C

## 2017-09-29 NOTE — Patient Instructions (Addendum)
Contact your HR department regarding FMLA paperwork Schedule an appointment with Dr. Darene Lamer to follow-up on your spinal stenosis and complete FMLA

## 2017-09-30 LAB — COMPLETE METABOLIC PANEL WITH GFR
AG RATIO: 1.6 (calc) (ref 1.0–2.5)
ALBUMIN MSPROF: 4.4 g/dL (ref 3.6–5.1)
ALKALINE PHOSPHATASE (APISO): 86 U/L (ref 33–115)
ALT: 66 U/L — ABNORMAL HIGH (ref 6–29)
AST: 39 U/L — ABNORMAL HIGH (ref 10–35)
BUN: 13 mg/dL (ref 7–25)
CHLORIDE: 106 mmol/L (ref 98–110)
CO2: 25 mmol/L (ref 20–32)
Calcium: 9.5 mg/dL (ref 8.6–10.2)
Creat: 0.6 mg/dL (ref 0.50–1.10)
GFR, Est African American: 125 mL/min/{1.73_m2} (ref >=60)
GFR, Est Non African American: 108 mL/min/{1.73_m2} (ref >=60)
GLOBULIN: 2.8 g/dL (ref 1.9–3.7)
GLUCOSE: 157 mg/dL — AB (ref 65–99)
Potassium: 4.5 mmol/L (ref 3.5–5.3)
Sodium: 140 mmol/L (ref 135–146)
Total Bilirubin: 0.4 mg/dL (ref 0.2–1.2)
Total Protein: 7.2 g/dL (ref 6.1–8.1)

## 2017-09-30 LAB — HIV ANTIBODY (ROUTINE TESTING W REFLEX): HIV 1&2 Ab, 4th Generation: NONREACTIVE

## 2017-09-30 NOTE — Progress Notes (Signed)
Please add-on A1c HIV test negative Normal kidney function Blood glucose was still high. This could be a sign of insulin resistance. Recommend reducing carbohydrates/starchy foods Liver enzymes were increased. This is likely due to fatty liver disease from obesity. Recommend avoiding alcohol, following a low-fat mediterranean diet and losing 5% of current body weight Follow-up in 3 months

## 2017-10-05 ENCOUNTER — Encounter: Payer: Self-pay | Admitting: Physician Assistant

## 2017-10-11 ENCOUNTER — Institutional Professional Consult (permissible substitution): Payer: Self-pay | Admitting: Sports Medicine

## 2017-10-13 ENCOUNTER — Encounter: Payer: Self-pay | Admitting: Sports Medicine

## 2017-10-13 ENCOUNTER — Ambulatory Visit (INDEPENDENT_AMBULATORY_CARE_PROVIDER_SITE_OTHER): Payer: Self-pay | Admitting: Sports Medicine

## 2017-10-13 DIAGNOSIS — M1712 Unilateral primary osteoarthritis, left knee: Secondary | ICD-10-CM

## 2017-10-13 DIAGNOSIS — M48061 Spinal stenosis, lumbar region without neurogenic claudication: Secondary | ICD-10-CM

## 2017-10-13 DIAGNOSIS — M170 Bilateral primary osteoarthritis of knee: Secondary | ICD-10-CM | POA: Insufficient documentation

## 2017-10-13 NOTE — Assessment & Plan Note (Signed)
Likely with a degenerative meniscal tear but no mechanical symptoms. Injection as above, return in 1 month.

## 2017-10-13 NOTE — Assessment & Plan Note (Signed)
Severe spinal stenosis at L4-L5, progressive quad weakness on the right, unfortunately this is going to need a decompressive type laminectomy. I would like Carl Best at [facility] to take a look at her. She will get her MRI burned to a disc. I do not think epidurals or can help her in this case.

## 2017-10-13 NOTE — Progress Notes (Signed)
Subjective:    CC: Back pain  HPI: Jeanette [name] returns, she is a pleasant 47 year old female, I saw her sometime ago for back pain, she is having a worsening of symptoms with progressive weakness of her right leg, predominantly on knee extension.  We did an MRI sometime ago that showed severe L4-L5 central canal stenosis.  I had ordered an epidural at the time because she was not having that much weakness, she never got this done.  No bowel or bladder dysfunction, saddle numbness, constitutional symptoms.  In addition she has left knee pain, medial joint line without mechanical symptoms, moderate, persistent without radiation.  Oral analgesics and NSAIDs have not been effective.  I reviewed the past medical history, family history, social history, surgical history, and allergies today and no changes were needed.  Please see the problem list section below in epic for further details.  Past Medical History: Past Medical History:  Diagnosis Date  . Abnormal Pap smear of cervix   . Anemia   . Dysmenorrhea   . Hemorrhoids   . Hypertension   . Menorrhagia   . Pelvic adhesions   . Peripheral neuropathy 01/21/2016  . Spinal stenosis at L4-L5 level 03/10/2016  . Tubo-ovarian abscess 10/2015   Past Surgical History: Past Surgical History:  Procedure Laterality Date  . APPENDECTOMY    . ENDOMETRIAL ABLATION  08/16/2015  . MANDIBLE SURGERY    . OOPHORECTOMY Right 10/2015   for ovarian abscess   Social History: Social History   Socioeconomic History  . Marital status: Significant Other    Spouse name: Not on file  . Number of children: Not on file  . Years of education: Not on file  . Highest education level: Not on file  Occupational History  . Not on file  Social Needs  . Financial resource strain: Not on file  . Food insecurity:    Worry: Not on file    Inability: Not on file  . Transportation needs:    Medical: Not on file    Non-medical: Not on file  Tobacco Use  . Smoking  status: Former Smoker    Last attempt to quit: 03/24/2015    Years since quitting: 2.5  . Smokeless tobacco: Never Used  Substance and Sexual Activity  . Alcohol use: Yes  . Drug use: No  . Sexual activity: Yes    Birth control/protection: Condom  Lifestyle  . Physical activity:    Days per week: Not on file    Minutes per session: Not on file  . Stress: Not on file  Relationships  . Social connections:    Talks on phone: Not on file    Gets together: Not on file    Attends religious service: Not on file    Active member of club or organization: Not on file    Attends meetings of clubs or organizations: Not on file    Relationship status: Not on file  Other Topics Concern  . Not on file  Social History Narrative   Recently relocated from [place] with her fiance   Family History: Family History  Problem Relation Age of Onset  . Diabetes Mother   . Stroke Mother   . Diabetes Father   . Hyperlipidemia Father   . Hypertension Father   . Heart attack Father   . Brain cancer Brother   . Diabetes Brother   . Hypertension Son   . Heart attack Paternal Grandmother   . Heart attack Paternal Grandfather   .  Diabetes Maternal Grandmother    Allergies: Allergies  Allergen Reactions  . Other Anaphylaxis    Sunflower oils   Medications: See med rec.  Review of Systems: No fevers, chills, night sweats, weight loss, chest pain, or shortness of breath.   Objective:    General: Well Developed, well nourished, and in no acute distress.  Neuro: Alert and oriented x3, extra-ocular muscles intact, sensation grossly intact.  HEENT: Normocephalic, atraumatic, pupils equal round reactive to light, neck supple, no masses, no lymphadenopathy, thyroid nonpalpable.  Skin: Warm and dry, no rashes. Cardiac: Regular rate and rhythm, no murmurs rubs or gallops, no lower extremity edema.  Respiratory: Clear to auscultation bilaterally. Not using accessory muscles, speaking in full  sentences. Left knee: Normal to inspection with no erythema or effusion or obvious bony abnormalities. Moderate tenderness at the medial joint line ROM normal in flexion and extension and lower leg rotation. Ligaments with solid consistent endpoints including ACL, PCL, LCL, MCL. Negative Mcmurray's and provocative meniscal tests. Non painful patellar compression. Patellar and quadriceps tendons unremarkable. Hamstring and quadriceps strength is normal.  Procedure: Real-time Ultrasound Guided Injection of left knee Device: GE Logiq E  Verbal informed consent obtained.  Time-out conducted.  Noted no overlying erythema, induration, or other signs of local infection.  Skin prepped in a sterile fashion.  Local anesthesia: Topical Ethyl chloride.  With sterile technique and under real time ultrasound guidance: I advanced a 25-gauge needle into the suprapatellar recess and injected 1 cc Kenalog 40, 2 cc lidocaine, 2 cc bupivacaine. Completed without difficulty  Pain immediately resolved suggesting accurate placement of the medication.  Advised to call if fevers/chills, erythema, induration, drainage, or persistent bleeding.  Images permanently stored and available for review in the ultrasound unit.  Impression: Technically successful ultrasound guided injection.  Impression and Recommendations:    Spinal stenosis at L4-L5 level Severe spinal stenosis at L4-L5, progressive quad weakness on the right, unfortunately this is going to need a decompressive type laminectomy. I would like [facility] at [facility] to take a look at her. She will get her MRI burned to a disc. I do not think epidurals or can help her in this case.  Primary osteoarthritis of left knee Likely with a degenerative meniscal tear but no mechanical symptoms. Injection as above, return in 1 month. ___________________________________________ Gwen Her. Dianah Field, M.D., ABFM., CAQSM. Primary Care and [place] Instructor of [employer] [facility] [employer]

## 2017-11-10 ENCOUNTER — Ambulatory Visit: Payer: Self-pay | Admitting: Physician Assistant

## 2017-11-10 ENCOUNTER — Encounter: Payer: Self-pay | Admitting: Sports Medicine

## 2017-11-10 ENCOUNTER — Ambulatory Visit (INDEPENDENT_AMBULATORY_CARE_PROVIDER_SITE_OTHER): Payer: Self-pay | Admitting: Sports Medicine

## 2017-11-10 DIAGNOSIS — M1712 Unilateral primary osteoarthritis, left knee: Secondary | ICD-10-CM

## 2017-11-10 DIAGNOSIS — M48061 Spinal stenosis, lumbar region without neurogenic claudication: Secondary | ICD-10-CM

## 2017-11-10 NOTE — Assessment & Plan Note (Signed)
There is likely a degenerative meniscal tear, she was doing well after injection a month ago but then had a fall with a recurrence of pain. Early repeat injection today. If persistent discomfort she will need an MRI and likely arthroscopy.

## 2017-11-10 NOTE — Progress Notes (Signed)
Subjective:    CC: Follow-up  HPI: Left knee pain: Osteoarthritis with likely a degenerative meniscal tear, she was doing well after an injection performed last month.  She had a misstep and a fall and now has a recurrence of pain in the right knee but not as bad as it was before.  She is wondering if we can do a repeat knee injection today.  Symptoms are moderate, persistent, localized to the medial joint line without radiation, no mechanical symptoms.  Spinal stenosis: Severe at L4-L5 with progressive weakness of the right leg.  She does likely need a decompressive laminectomy but she is uninsured, unable to see Dr. Rogers Blocker at [facility] neurosurgery.  No bowel or bladder dysfunction.  I reviewed the past medical history, family history, social history, surgical history, and allergies today and no changes were needed.  Please see the problem list section below in epic for further details.  Past Medical History: Past Medical History:  Diagnosis Date  . Abnormal Pap smear of cervix   . Anemia   . Dysmenorrhea   . Hemorrhoids   . Hypertension   . Menorrhagia   . Pelvic adhesions   . Peripheral neuropathy 01/21/2016  . Spinal stenosis at L4-L5 level 03/10/2016  . Tubo-ovarian abscess 10/2015   Past Surgical History: Past Surgical History:  Procedure Laterality Date  . APPENDECTOMY    . ENDOMETRIAL ABLATION  08/16/2015  . MANDIBLE SURGERY    . OOPHORECTOMY Right 10/2015   for ovarian abscess   Social History: Social History   Socioeconomic History  . Marital status: Significant Other    Spouse name: Not on file  . Number of children: Not on file  . Years of education: Not on file  . Highest education level: Not on file  Occupational History  . Not on file  Social Needs  . Financial resource strain: Not on file  . Food insecurity:    Worry: Not on file    Inability: Not on file  . Transportation needs:    Medical: Not on file    Non-medical: Not on file    Tobacco Use  . Smoking status: Former Smoker    Last attempt to quit: 03/24/2015    Years since quitting: 2.6  . Smokeless tobacco: Never Used  Substance and Sexual Activity  . Alcohol use: Yes  . Drug use: No  . Sexual activity: Yes    Birth control/protection: Condom  Lifestyle  . Physical activity:    Days per week: Not on file    Minutes per session: Not on file  . Stress: Not on file  Relationships  . Social connections:    Talks on phone: Not on file    Gets together: Not on file    Attends religious service: Not on file    Active member of club or organization: Not on file    Attends meetings of clubs or organizations: Not on file    Relationship status: Not on file  Other Topics Concern  . Not on file  Social History Narrative   Recently relocated from [place] with her fiance   Family History: Family History  Problem Relation Age of Onset  . Diabetes Mother   . Stroke Mother   . Diabetes Father   . Hyperlipidemia Father   . Hypertension Father   . Heart attack Father   . Brain cancer Brother   . Diabetes Brother   . Hypertension Son   . Heart attack Paternal  Grandmother   . Heart attack Paternal Grandfather   . Diabetes Maternal Grandmother    Allergies: Allergies  Allergen Reactions  . Other Anaphylaxis    Sunflower oils   Medications: See med rec.  Review of Systems: No fevers, chills, night sweats, weight loss, chest pain, or shortness of breath.   Objective:    General: Well Developed, well nourished, and in no acute distress.  Neuro: Alert and oriented x3, extra-ocular muscles intact, sensation grossly intact.  HEENT: Normocephalic, atraumatic, pupils equal round reactive to light, neck supple, no masses, no lymphadenopathy, thyroid nonpalpable.  Skin: Warm and dry, no rashes. Cardiac: Regular rate and rhythm, no murmurs rubs or gallops, no lower extremity edema.  Respiratory: Clear to auscultation bilaterally. Not using accessory muscles,  speaking in full sentences. Left knee: Minimal swelling with tenderness at the medial joint line ROM normal in flexion and extension and lower leg rotation. Ligaments with solid consistent endpoints including ACL, PCL, LCL, MCL. Negative Mcmurray's and provocative meniscal tests. Non painful patellar compression. Patellar and quadriceps tendons unremarkable. Hamstring and quadriceps strength is normal.  Procedure: Real-time Ultrasound Guided Injection of left knee Device: GE Logiq E  Verbal informed consent obtained.  Time-out conducted.  Noted no overlying erythema, induration, or other signs of local infection.  Skin prepped in a sterile fashion.  Local anesthesia: Topical Ethyl chloride.  With sterile technique and under real time ultrasound guidance: 1 cc kenalog 40, 2 cc lidocaine, 2 cc bupivacaine injected easily. Completed without difficulty  Pain immediately resolved suggesting accurate placement of the medication.  Advised to call if fevers/chills, erythema, induration, drainage, or persistent bleeding.  Images permanently stored and available for review in the ultrasound unit.  Impression: Technically successful ultrasound guided injection.  Impression and Recommendations:    Spinal stenosis at L4-L5 level Severe spinal stenosis at L4-L5 with progressive quad weakness on the right. This is most certainly going to need a decompressive laminectomy. We tried Carl Best at [facility] but they do not take self-pay patients, I am going to try Dr. Louanne Skye or Dr. Lorin Mercy at [facility]. I am also going to try a right L4-L5 transforaminal epidural in the meantime.  Primary osteoarthritis of left knee There is likely a degenerative meniscal tear, she was doing well after injection a month ago but then had a fall with a recurrence of pain. Early repeat injection today. If persistent discomfort she will need an MRI and likely  arthroscopy. ___________________________________________ Gwen Her. Dianah Field, M.D., ABFM., CAQSM. Primary Care and [place] Instructor of [place] [employer] [facility] of [employer]

## 2017-11-10 NOTE — Assessment & Plan Note (Signed)
Severe spinal stenosis at L4-L5 with progressive quad weakness on the right. This is most certainly going to need a decompressive laminectomy. We tried Carl Best at [facility] but they do not take self-pay patients, I am going to try Dr. Louanne Skye or Dr. Lorin Mercy at [place]. I am also going to try a right L4-L5 transforaminal epidural in the meantime.

## 2017-11-29 ENCOUNTER — Ambulatory Visit: Payer: Self-pay | Admitting: Physician Assistant

## 2017-12-06 ENCOUNTER — Ambulatory Visit (INDEPENDENT_AMBULATORY_CARE_PROVIDER_SITE_OTHER): Payer: Self-pay | Admitting: Orthopaedic Surgery

## 2017-12-13 ENCOUNTER — Ambulatory Visit (INDEPENDENT_AMBULATORY_CARE_PROVIDER_SITE_OTHER): Payer: Self-pay | Admitting: Orthopaedic Surgery

## 2017-12-15 ENCOUNTER — Ambulatory Visit: Payer: Self-pay | Admitting: Physician Assistant

## 2017-12-15 ENCOUNTER — Ambulatory Visit (INDEPENDENT_AMBULATORY_CARE_PROVIDER_SITE_OTHER): Payer: Self-pay | Admitting: Sports Medicine

## 2017-12-15 ENCOUNTER — Encounter: Payer: Self-pay | Admitting: Sports Medicine

## 2017-12-15 ENCOUNTER — Ambulatory Visit (INDEPENDENT_AMBULATORY_CARE_PROVIDER_SITE_OTHER): Payer: Self-pay

## 2017-12-15 DIAGNOSIS — M1712 Unilateral primary osteoarthritis, left knee: Secondary | ICD-10-CM

## 2017-12-15 DIAGNOSIS — M170 Bilateral primary osteoarthritis of knee: Secondary | ICD-10-CM

## 2017-12-15 DIAGNOSIS — M48061 Spinal stenosis, lumbar region without neurogenic claudication: Secondary | ICD-10-CM

## 2017-12-15 MED ORDER — TRAMADOL HCL 50 MG PO TABS: 50.0000 mg | ORAL_TABLET | Freq: Three times a day (TID) | ORAL | 0 refills | Status: DC | PRN

## 2017-12-15 MED ORDER — MELOXICAM 15 MG PO TABS: ORAL_TABLET | ORAL | 3 refills | Status: DC

## 2017-12-15 NOTE — Assessment & Plan Note (Addendum)
Persistent pain in spite of 2 injections, medial joint line with mechanical symptoms. X-rays today, MRI ordered, we will probably need to get her in with 1 of the orthopedists at [facility] for arthroscopy if she does not have a whole lot of arthritis on her x-ray. Adding some tramadol and meloxicam in the meantime.

## 2017-12-15 NOTE — Progress Notes (Signed)
Subjective:    CC: Follow-up  HPI: Lumbar spinal stenosis: Has an appoint with Dr. Lorin Mercy, she does have quad weakness so we did discuss the likelihood of laminectomy.  Knee pain: We did an injection at the last visit, she has medial pain with mechanical symptoms consistent with meniscal injury.  Persistent discomfort, only 30% relief with ultrasound-guided injection.  I reviewed the past medical history, family history, social history, surgical history, and allergies today and no changes were needed.  Please see the problem list section below in epic for further details.  Past Medical History: Past Medical History:  Diagnosis Date  . Abnormal Pap smear of cervix   . Anemia   . Dysmenorrhea   . Hemorrhoids   . Hypertension   . Menorrhagia   . Pelvic adhesions   . Peripheral neuropathy 01/21/2016  . Spinal stenosis at L4-L5 level 03/10/2016  . Tubo-ovarian abscess 10/2015   Past Surgical History: Past Surgical History:  Procedure Laterality Date  . APPENDECTOMY    . ENDOMETRIAL ABLATION  08/16/2015  . MANDIBLE SURGERY    . OOPHORECTOMY Right 10/2015   for ovarian abscess   Social History: Social History   Socioeconomic History  . Marital status: Significant Other    Spouse name: Not on file  . Number of children: Not on file  . Years of education: Not on file  . Highest education level: Not on file  Occupational History  . Not on file  Social Needs  . Financial resource strain: Not on file  . Food insecurity:    Worry: Not on file    Inability: Not on file  . Transportation needs:    Medical: Not on file    Non-medical: Not on file  Tobacco Use  . Smoking status: Former Smoker    Last attempt to quit: 03/24/2015    Years since quitting: 2.7  . Smokeless tobacco: Never Used  Substance and Sexual Activity  . Alcohol use: Yes  . Drug use: No  . Sexual activity: Yes    Birth control/protection: Condom  Lifestyle  . Physical activity:    Days per week: Not on  file    Minutes per session: Not on file  . Stress: Not on file  Relationships  . Social connections:    Talks on phone: Not on file    Gets together: Not on file    Attends religious service: Not on file    Active member of club or organization: Not on file    Attends meetings of clubs or organizations: Not on file    Relationship status: Not on file  Other Topics Concern  . Not on file  Social History Narrative   Recently relocated from [place] with [name] fiance   Family History: Family History  Problem Relation Age of Onset  . Diabetes Mother   . Stroke Mother   . Diabetes Father   . Hyperlipidemia Father   . Hypertension Father   . Heart attack Father   . Brain cancer Brother   . Diabetes Brother   . Hypertension Son   . Heart attack Paternal Grandmother   . Heart attack Paternal Grandfather   . Diabetes Maternal Grandmother    Allergies: Allergies  Allergen Reactions  . Other Anaphylaxis    Sunflower oils   Medications: See med rec.  Review of Systems: No fevers, chills, night sweats, weight loss, chest pain, or shortness of breath.   Objective:    General: Well Developed, well nourished,  and in no acute distress.  Neuro: Alert and oriented x3, extra-ocular muscles intact, sensation grossly intact.  HEENT: Normocephalic, atraumatic, pupils equal round reactive to light, neck supple, no masses, no lymphadenopathy, thyroid nonpalpable.  Skin: Warm and dry, no rashes. Cardiac: Regular rate and rhythm, no murmurs rubs or gallops, no lower extremity edema.  Respiratory: Clear to auscultation bilaterally. Not using accessory muscles, speaking in full sentences. Left knee: Normal to inspection with no erythema or effusion or obvious bony abnormalities. Persistent pain at the medial joint line ROM normal in flexion and extension and lower leg rotation. Ligaments with solid consistent endpoints including ACL, PCL, LCL, MCL. Negative Mcmurray's and provocative meniscal  tests. Non painful patellar compression. Patellar and quadriceps tendons unremarkable. Hamstring and quadriceps strength is normal.  Impression and Recommendations:    Primary osteoarthritis of left knee Persistent pain in spite of 2 injections, medial joint line with mechanical symptoms. X-rays today, MRI ordered, we will probably need to get [name] in with 1 of the orthopedists at [facility] for arthroscopy if she does not have a whole lot of arthritis on [name] x-ray. Adding some tramadol and meloxicam in the meantime.  Spinal stenosis at L4-L5 level Severe spinal stenosis at L4-L5 with progressive quad weakness on the right. She does have an appointment with Dr. Lorin Mercy, she has not gotten [name] epidural. I do think she is going to need a decompressive laminectomy considering [name] weakness. ___________________________________________ Jeanette [name]. Jeanette [name], M.D., ABFM., CAQSM. Primary Care and Sports Medicine [facility] [facility]  Adjunct Professor of [place] of [facility] of [employer]

## 2017-12-15 NOTE — Assessment & Plan Note (Signed)
Severe spinal stenosis at L4-L5 with progressive quad weakness on the right. She does have an appointment with Dr. Lorin Mercy, she has not gotten her epidural. I do think she is going to need a decompressive laminectomy considering her weakness.

## 2018-01-02 ENCOUNTER — Other Ambulatory Visit: Payer: Self-pay

## 2018-01-09 ENCOUNTER — Ambulatory Visit (INDEPENDENT_AMBULATORY_CARE_PROVIDER_SITE_OTHER): Payer: Self-pay | Admitting: Physician Assistant

## 2018-01-09 ENCOUNTER — Encounter: Payer: Self-pay | Admitting: Physician Assistant

## 2018-01-09 VITALS — BP 144/81 | Temp 98.2°F | Wt 228.0 lb

## 2018-01-09 DIAGNOSIS — I10 Essential (primary) hypertension: Secondary | ICD-10-CM

## 2018-01-09 DIAGNOSIS — G588 Other specified mononeuropathies: Secondary | ICD-10-CM

## 2018-01-09 DIAGNOSIS — R3589 Other polyuria: Secondary | ICD-10-CM

## 2018-01-09 DIAGNOSIS — E119 Type 2 diabetes mellitus without complications: Secondary | ICD-10-CM

## 2018-01-09 DIAGNOSIS — Z206 Contact with and (suspected) exposure to human immunodeficiency virus [HIV]: Secondary | ICD-10-CM

## 2018-01-09 DIAGNOSIS — Z79899 Other long term (current) drug therapy: Secondary | ICD-10-CM

## 2018-01-09 DIAGNOSIS — R358 Other polyuria: Secondary | ICD-10-CM

## 2018-01-09 DIAGNOSIS — R81 Glycosuria: Secondary | ICD-10-CM

## 2018-01-09 LAB — POCT GLYCOSYLATED HEMOGLOBIN (HGB A1C): HbA1c, POC (controlled diabetic range): 6.7 % (ref 0.0–7.0)

## 2018-01-09 LAB — POCT URINALYSIS DIPSTICK
Bilirubin, UA: NEGATIVE
Blood, UA: NEGATIVE
Glucose, UA: POSITIVE — AB
Ketones, UA: NEGATIVE
Leukocytes, UA: NEGATIVE
Nitrite, UA: NEGATIVE
Protein, UA: NEGATIVE
Spec Grav, UA: 1.025 (ref 1.010–1.025)
UROBILINOGEN UA: 0.2 U/dL
pH, UA: 5.5 (ref 5.0–8.0)

## 2018-01-09 MED ORDER — LISINOPRIL-HYDROCHLOROTHIAZIDE 20-25 MG PO TABS: 1.0000 | ORAL_TABLET | Freq: Every day | ORAL | 0 refills | Status: DC

## 2018-01-09 MED ORDER — METFORMIN HCL 500 MG PO TABS: ORAL_TABLET | ORAL | 2 refills | Status: DC

## 2018-01-09 MED ORDER — EMTRICITABINE-TENOFOVIR DF 200-300 MG PO TABS: 1.0000 | ORAL_TABLET | Freq: Every day | ORAL | 2 refills | Status: DC

## 2018-01-09 NOTE — Patient Instructions (Signed)
Type 2 Diabetes Mellitus, Diagnosis, Adult Type 2 diabetes (type 2 diabetes mellitus) is a long-term (chronic) disease. It may be caused by one or both of these problems:  Your body does not make enough of a hormone called insulin.  Your body does not react in a normal way to insulin that it makes.  Insulin lets sugars (glucose) go into cells in the body. This gives you energy. If you have type 2 diabetes, sugars cannot get into cells. This causes high blood sugar (hyperglycemia). Your doctor will set treatment goals for you. Generally, you should have these blood sugar levels:  Before meals (preprandial): 80-130 mg/dL (4.4-7.2 mmol/L).  After meals (postprandial): below 180 mg/dL (10 mmol/L).  A1c (hemoglobin A1c) level: less than 7%.  Follow these instructions at home: Questions to Ask Your Doctor  You may want to ask these questions:  Do I need to meet with a diabetes educator?  Where can I find a support group for people with diabetes?  What equipment will I need to care for myself at home?  What diabetes medicines do I need? When should I take them?  How often do I need to check my blood sugar?  What number can I call if I have questions?  When is my next doctor's visit?  General instructions  Take over-the-counter and prescription medicines only as told by your doctor.  Keep all follow-up visits as told by your doctor. This is important. Contact a doctor if:  Your blood sugar is at or above 240 mg/dL (13.3 mmol/L) for 2 days in a row.  You have been sick or have had a fever for 2 days or more and you are not getting better.  You have any of these problems for more than 6 hours: ? You cannot eat or drink. ? You feel sick to your stomach (nauseous). ? You throw up (vomit). ? You have watery poop (diarrhea). Get help right away if:  Your blood sugar is lower than 54 mg/dL (3 mmol/L).  You get confused.  You have trouble: ? Thinking  clearly. ? Breathing.  You have moderate or large ketone levels in your pee (urine). This information is not intended to replace advice given to you by your health care provider. Make sure you discuss any questions you have with your health care provider. Document Released: 11/04/2007 Document Revised: 07/03/2015 Document Reviewed: 02/28/2015 Elsevier Interactive Patient Education  Jeanette [name].

## 2018-01-09 NOTE — Progress Notes (Signed)
HPI:                                                                Jeanette [name] is a 47 y.o. female who presents to [facility] today for medication management  PrEP: she is still with the same female HIV+ partner, whose viral load is undetectable. She occasionally misses a dose, but overall is very compliant. They use condoms inconsistently. Last HIV antibody Aug 2019 was negative.   HTN: taking Prinzide daily. Compliant with medications. Does not check BP's at home.  Denies vision change, , chest pain with exertion, orthopnea, syncope and edema. Risk factors include: obesity, family history  For the last 2 weeks she has had urinary frequency and suprapubic pressure.  Denies hematuria, dysuria, flank pain. She missed work today. States she woke up feeling very dizzy and lightheaded with a mild headache.  Denies vision change, thunderclap headache, neck stiffness, syncope.   Past Medical History:  Diagnosis Date  . Abnormal Pap smear of cervix   . Anemia   . Dysmenorrhea   . Hemorrhoids   . Hypertension   . Menorrhagia   . Pelvic adhesions   . Peripheral neuropathy 01/21/2016  . Spinal stenosis at L4-L5 level 03/10/2016  . Tubo-ovarian abscess 10/2015   Past Surgical History:  Procedure Laterality Date  . APPENDECTOMY    . ENDOMETRIAL ABLATION  08/16/2015  . MANDIBLE SURGERY    . OOPHORECTOMY Right 10/2015   for ovarian abscess   Social History   Tobacco Use  . Smoking status: Former Smoker    Last attempt to quit: 03/24/2015    Years since quitting: 2.8  . Smokeless tobacco: Never Used  Substance Use Topics  . Alcohol use: Yes   family history includes Brain cancer in her brother; Diabetes in her brother, father, maternal grandmother, and mother; Diabetes type I in her other; Heart attack in her father, paternal grandfather, and paternal grandmother; Hyperlipidemia in her father; Hypertension in her father and son; Stroke in  her mother.    ROS: negative except as noted in the HPI  Medications: Current Outpatient Medications  Medication Sig Dispense Refill  . emtricitabine-tenofovir (TRUVADA) 200-300 MG tablet Take 1 tablet by mouth daily. 30 tablet 2  . gabapentin (NEURONTIN) 300 MG capsule Take 1-2 capsules (300-600 mg total) by mouth at bedtime. 90 capsule 3  . lisinopril-hydrochlorothiazide (PRINZIDE,ZESTORETIC) 20-25 MG tablet Take 1 tablet by mouth daily. 90 tablet 0  . meloxicam (MOBIC) 15 MG tablet One tab PO qAM with breakfast for 2 weeks, then daily prn pain. 30 tablet 3  . metFORMIN (GLUCOPHAGE) 500 MG tablet Take 1 tablet (500 mg total) by mouth daily with supper for 14 days, THEN 1 tablet (500 mg total) 2 (two) times daily with a meal for 14 days. 60 tablet 2  . traMADol (ULTRAM) 50 MG tablet Take 1 tablet (50 mg total) by mouth every 8 (eight) hours as needed for moderate pain. Maximum 6 tabs per day. 21 tablet 0   No current facility-administered medications for this visit.    Allergies  Allergen Reactions  . Other Anaphylaxis    Sunflower oils       Objective:  BP (!) 144/81   Temp 98.2  F (36.8 C) (Oral)   Wt 228 lb (103.4 kg)   BMI 36.80 kg/m  Gen:  alert, not ill-appearing, no distress, appropriate for age, obese female HEENT: head normocephalic without obvious abnormality, conjunctiva and cornea clear, trachea midline Pulm: Normal work of breathing, normal phonation, clear to auscultation bilaterally, no wheezes, rales or rhonchi CV: Normal rate, regular rhythm, s1 and s2 distinct, no murmurs, clicks or rubs  Neuro: alert and oriented x 3, no tremor MSK: extremities atraumatic, normal gait and station Skin: intact, no rashes on exposed skin, no jaundice, no cyanosis Psych: well-groomed, cooperative, good eye contact, euthymic mood, affect mood-congruent, speech is articulate, and thought processes clear and goal-directed  Diabetic Foot Exam - Simple   Simple Foot  Form Diabetic Foot exam was performed with the following findings:  Yes 01/09/2018  2:31 PM  Visual Inspection No deformities, no ulcerations, no other skin breakdown bilaterally:  Yes Sensation Testing See comments:  Yes Pulse Check Posterior Tibialis and Dorsalis pulse intact bilaterally:  Yes Comments Sensation absent to monofilament testing on the right foot at 7,8, and 9     No results found for: HIV1X2 Lab Results  Component Value Date   CREATININE 0.60 09/29/2017   BUN 13 09/29/2017   NA 140 09/29/2017   K 4.5 09/29/2017   CL 106 09/29/2017   CO2 25 09/29/2017     Results for orders placed or performed in visit on 01/09/18 (from the past 72 hour(s))  POCT HgB A1C     Status: Abnormal   Collection Time: 01/09/18  3:00 PM  Result Value Ref Range   Hemoglobin A1C     HbA1c POC (<> result, manual entry)     HbA1c, POC (prediabetic range)     HbA1c, POC (controlled diabetic range) 6.7 0.0 - 7.0 %  POCT Urinalysis Dipstick     Status: Abnormal   Collection Time: 01/09/18  4:18 PM  Result Value Ref Range   Color, UA yellow    Clarity, UA cloudy    Glucose, UA Positive (A) Negative   Bilirubin, UA negative    Ketones, UA negative    Spec Grav, UA 1.025 1.010 - 1.025   Blood, UA negative    pH, UA 5.5 5.0 - 8.0   Protein, UA Negative Negative   Urobilinogen, UA 0.2 0.2 or 1.0 E.U./dL   Nitrite, UA negative    Leukocytes, UA Negative Negative   Appearance     Odor    Urine Culture     Status: None   Collection Time: 01/09/18  4:18 PM  Result Value Ref Range   MICRO NUMBER: [id number]    SPECIMEN QUALITY: Adequate    Sample Source URINE    STATUS: FINAL    Result: No Growth    No results found.    Assessment and Plan: 47 y.o. female with   .Jeanette [name] was seen today for headache and dysuria.  Diagnoses and all orders for this visit:  Encounter for long-term (current) use of medications -     HIV antibody (with reflex)  Contact with and (suspected) exposure  to human immunodeficiency virus (hiv) -     emtricitabine-tenofovir (TRUVADA) 200-300 MG tablet; Take 1 tablet by mouth daily. -     HIV antibody (with reflex)  Glucosuria -     POCT HgB A1C -     POCT Urinalysis Dipstick -     Urine Culture  Other long term (current) drug therapy -  emtricitabine-tenofovir (TRUVADA) 200-300 MG tablet; Take 1 tablet by mouth daily.  Essential hypertension -     lisinopril-hydrochlorothiazide (PRINZIDE,ZESTORETIC) 20-25 MG tablet; Take 1 tablet by mouth daily.  New onset type 2 diabetes mellitus (HCC) -     metFORMIN (GLUCOPHAGE) 500 MG tablet; Take 1 tablet (500 mg total) by mouth daily with supper for 14 days, THEN 1 tablet (500 mg total) 2 (two) times daily with a meal for 14 days. -     Lipid Profile -     COMPLETE METABOLIC PANEL WITH GFR -     Ambulatory referral to diabetic education  Polyuria -     POCT Urinalysis Dipstick -     Urine Culture  Other mononeuropathy    Polyuria, New onset Type 2 DM Point-of-care urinalysis was positive for glucose.  Point-of-care hemoglobin A1c was performed and showed new diagnosis of type 2 diabetes with A1c of 6.7.   She was briefly counseled on diagnosis of type 2 diabetes and a referral was placed to the diabetic educator. Since she is symptomatic, experiencing polyuria and lightheadedness, will start low-dose metformin. Foot exam performed in office today.  Sensory abnormality noted at the right heel and midfoot, however patient has known severe spinal stenosis and existing peripheral neuropathy. Advised to schedule dilated eye exam with ophthalmology Blood pressure goal less than 130/80 continue ACE inhibitor LDL goal <70, fasting lipids pending  PrEP Since she is uninsured and has low risk sexual behaviors, we have been performing HIV testing every 6 months.  She is aware that the guideline is every 3 months, and she will be screened every 3 months once she has insurance coverage. Recheck HIV  antibody and renal function in February  Patient education and anticipatory guidance given Patient agrees with treatment plan Follow-up as needed if symptoms worsen or fail to improve  Darlyne Russian PA-C

## 2018-01-10 ENCOUNTER — Ambulatory Visit (INDEPENDENT_AMBULATORY_CARE_PROVIDER_SITE_OTHER): Payer: Self-pay | Admitting: Orthopaedic Surgery

## 2018-01-10 LAB — URINE CULTURE
MICRO NUMBER:: 91439749
Result:: NO GROWTH
SPECIMEN QUALITY:: ADEQUATE

## 2018-01-12 ENCOUNTER — Encounter: Payer: Self-pay | Admitting: Physician Assistant

## 2018-01-23 ENCOUNTER — Ambulatory Visit: Payer: Self-pay | Admitting: Physician Assistant

## 2018-02-03 ENCOUNTER — Ambulatory Visit: Payer: Self-pay | Admitting: Physician Assistant

## 2018-02-07 ENCOUNTER — Ambulatory Visit (INDEPENDENT_AMBULATORY_CARE_PROVIDER_SITE_OTHER): Payer: Self-pay | Admitting: Physician Assistant

## 2018-02-07 ENCOUNTER — Encounter: Payer: Self-pay | Admitting: Physician Assistant

## 2018-02-07 VITALS — BP 139/80 | HR 70 | Temp 98.1°F | Wt 229.0 lb

## 2018-02-07 DIAGNOSIS — R1900 Intra-abdominal and pelvic swelling, mass and lump, unspecified site: Secondary | ICD-10-CM

## 2018-02-07 DIAGNOSIS — Z1231 Encounter for screening mammogram for malignant neoplasm of breast: Secondary | ICD-10-CM

## 2018-02-07 DIAGNOSIS — M1712 Unilateral primary osteoarthritis, left knee: Secondary | ICD-10-CM

## 2018-02-07 DIAGNOSIS — M48061 Spinal stenosis, lumbar region without neurogenic claudication: Secondary | ICD-10-CM

## 2018-02-07 DIAGNOSIS — R202 Paresthesia of skin: Secondary | ICD-10-CM

## 2018-02-07 MED ORDER — TRAMADOL HCL 50 MG PO TABS: 50.0000 mg | ORAL_TABLET | Freq: Four times a day (QID) | ORAL | 0 refills | Status: DC | PRN

## 2018-02-07 NOTE — Progress Notes (Signed)
HPI:                                                                Jeanette [name] is a 47 y.o. female who presents to [facility] [place]: Jeanette [name] today for several concerns  Right-sided numbness in her flank area present for the last year.  Numbness is constant, unchanged.  There is no associated pain.  No change in bowel habits.  She denies upper extremity weakness.  She has had a suspected lipoma of her left upper abdominal wall/ rib area for approx 1 year. It was asymptomatic but recently she has had sharp pain, especially when laying on her stomach.    Past Medical History:  Diagnosis Date  . Abnormal Pap smear of cervix   . Anemia   . Dysmenorrhea   . Hemorrhoids   . Hypertension   . Menorrhagia   . Pelvic adhesions   . Peripheral neuropathy 01/21/2016  . Spinal stenosis at L4-L5 level 03/10/2016  . Tubo-ovarian abscess 10/2015   Past Surgical History:  Procedure Laterality Date  . APPENDECTOMY    . ENDOMETRIAL ABLATION  08/16/2015  . MANDIBLE SURGERY    . OOPHORECTOMY Right 10/2015   for ovarian abscess   Social History   Tobacco Use  . Smoking status: Former Smoker    Last attempt to quit: 03/24/2015    Years since quitting: 2.8  . Smokeless tobacco: Never Used  Substance Use Topics  . Alcohol use: Yes   family history includes Brain cancer in her brother; Diabetes in her brother, father, maternal grandmother, and mother; Diabetes type I in an other family member; Heart attack in her father, paternal grandfather, and paternal grandmother; Hyperlipidemia in her father; Hypertension in her father and son; Stroke in her mother.    ROS: negative except as noted in the HPI  Medications: Current Outpatient Medications  Medication Sig Dispense Refill  . emtricitabine-tenofovir (TRUVADA) 200-300 MG tablet Take 1 tablet by mouth daily. 30 tablet 2  . gabapentin (NEURONTIN) 300 MG capsule Take 1-2 capsules (300-600 mg total) by mouth at  bedtime. 90 capsule 3  . lisinopril-hydrochlorothiazide (PRINZIDE,ZESTORETIC) 20-25 MG tablet Take 1 tablet by mouth daily. 90 tablet 0  . meloxicam (MOBIC) 15 MG tablet One tab PO qAM with breakfast for 2 weeks, then daily prn pain. 30 tablet 3  . metFORMIN (GLUCOPHAGE) 500 MG tablet Take 1 tablet (500 mg total) by mouth daily with supper for 14 days, THEN 1 tablet (500 mg total) 2 (two) times daily with a meal for 14 days. 60 tablet 2  . traMADol (ULTRAM) 50 MG tablet Take 1 tablet (50 mg total) by mouth every 6 (six) hours as needed for moderate pain or severe pain. Maximum 6 tabs per day. 30 tablet 0   No current facility-administered medications for this visit.    Allergies  Allergen Reactions  . Other Anaphylaxis    Sunflower oils       Objective:  There were no vitals taken for this visit. Gen:  alert, not ill-appearing, no distress, appropriate for age 73: head normocephalic without obvious abnormality, conjunctiva and cornea clear, trachea midline Pulm: Normal work of breathing, normal phonation Neuro: alert and oriented x 3, sensation intact to sharp dull  testing over the right flank MSK: extremities atraumatic, antalgic gait and station Skin: intact, approximately 1-1/2 cm medium firm mobile subcutaneous mass of the left upper abdominal wall overlying the lower ribs Psych: well-groomed, cooperative, good eye contact, euthymic mood, affect mood-congruent, speech is articulate, and thought processes clear and goal-directed    No results found for this or any previous visit (from the past 72 hour(s)). No results found.    Assessment and Plan: 47 y.o. female with   .Diagnoses and all orders for this visit:  Breast cancer screening by mammogram -     MM 3D SCREEN BREAST BILATERAL; Future  Primary osteoarthritis of left knee  Degenerative lumbar spinal stenosis -     traMADol (ULTRAM) 50 MG tablet; Take 1 tablet (50 mg total) by mouth every 6 (six) hours as needed  for moderate pain or severe pain. Maximum 6 tabs per day.  Mass of soft tissue of abdomen -     US Soft Tissue Head/Neck; Future   Paresthesia of right flank We discussed that the flank numbness is likely related to her spinal stenosis, likely at the thoracic level.  I reminded her of her most recent assessment with sports medicine in which she was referred to neurosurgery.    Reminded her of the concern that her symptoms will be progressive and could lead to paresis.  She is currently self-pay and unable to afford additional advanced imaging of the thoracic spine. She states she is unable to afford the co-pay for the surgical consult. I encouraged her to prioritize this appointment in 2020, to get rescheduled in the next 1-2 months and do everything in her power to save money for the co-pay  Abdominal wall mass Still suspect lipoma.  Will obtain soft tissue ultrasound to confirm.   Patient education and anticipatory guidance given Patient agrees with treatment plan Follow-up as needed if symptoms worsen or fail to improve  Darlyne Russian PA-C

## 2018-02-07 NOTE — Patient Instructions (Addendum)
[email or web address] You should apply for disability benefits as soon as you become disabled. If you are ready to apply now, you can:  1. Complete your application online. 2. Call our toll-free telephone number [phone]. If you are deaf or hard of hearing, you can call us at [place]. 3. Call or visit your local Social Security office. If you want to apply in person, please call and make an appointment before you visit your local office.  Here is the kind of information you should have:  Information About You Your Social Security number and proof of your age; Names, addresses, and phone numbers of doctors, caseworkers, hospitals, and clinics that took care of you and the dates of your visits; Names and dosages of all the medications you are taking; Medical records from your doctors, therapists, hospitals, clinics, and caseworkers, that you already have in your possession; Laboratory and test results; A summary of where you worked and the kind of work you did; and Your most recent [place] form or, if you were self-employed, a copy of your federal tax return. Information About Family Members Social Security numbers and proof of age for each family member who may qualify for benefits; and Proof of marriage, if your spouse is applying for benefits, as well as dates of prior marriages, if applicable. The documents you may need to show [place] must be original documents or copies certified by the issuing office. You can mail or bring them to [employer]. We will make photocopies and return your original documents. If you don't have all the documents you need, don't delay filing for benefits. We will help you get the information you need.  More Help For Filing Your Claim Our Disability Starter Kit will help you get ready for your disability interview or online application. Starter kits are available in [place] or Spanish for adults and children under age  105.  The online Application for Benefits also includes links to information that will help you complete the form.

## 2018-02-09 DIAGNOSIS — R1900 Intra-abdominal and pelvic swelling, mass and lump, unspecified site: Secondary | ICD-10-CM | POA: Insufficient documentation

## 2018-02-09 DIAGNOSIS — R202 Paresthesia of skin: Secondary | ICD-10-CM | POA: Insufficient documentation

## 2018-03-02 ENCOUNTER — Ambulatory Visit: Payer: Self-pay

## 2018-03-09 ENCOUNTER — Ambulatory Visit: Payer: Self-pay

## 2018-03-16 ENCOUNTER — Ambulatory Visit: Payer: Self-pay

## 2018-04-25 ENCOUNTER — Encounter: Payer: Self-pay | Admitting: Physician Assistant

## 2018-04-25 ENCOUNTER — Ambulatory Visit (INDEPENDENT_AMBULATORY_CARE_PROVIDER_SITE_OTHER): Payer: BLUE CROSS/BLUE SHIELD | Admitting: Physician Assistant

## 2018-04-25 ENCOUNTER — Other Ambulatory Visit: Payer: Self-pay | Admitting: Physician Assistant

## 2018-04-25 ENCOUNTER — Other Ambulatory Visit: Payer: Self-pay

## 2018-04-25 VITALS — BP 126/84 | HR 69 | Wt 231.0 lb

## 2018-04-25 DIAGNOSIS — Z79899 Other long term (current) drug therapy: Secondary | ICD-10-CM | POA: Diagnosis not present

## 2018-04-25 DIAGNOSIS — I10 Essential (primary) hypertension: Secondary | ICD-10-CM | POA: Diagnosis not present

## 2018-04-25 DIAGNOSIS — Z206 Contact with and (suspected) exposure to human immunodeficiency virus [HIV]: Secondary | ICD-10-CM | POA: Diagnosis not present

## 2018-04-25 DIAGNOSIS — M5410 Radiculopathy, site unspecified: Secondary | ICD-10-CM | POA: Insufficient documentation

## 2018-04-25 DIAGNOSIS — E119 Type 2 diabetes mellitus without complications: Secondary | ICD-10-CM | POA: Diagnosis not present

## 2018-04-25 DIAGNOSIS — E1165 Type 2 diabetes mellitus with hyperglycemia: Secondary | ICD-10-CM | POA: Insufficient documentation

## 2018-04-25 DIAGNOSIS — M48061 Spinal stenosis, lumbar region without neurogenic claudication: Secondary | ICD-10-CM

## 2018-04-25 DIAGNOSIS — R740 Nonspecific elevation of levels of transaminase and lactic acid dehydrogenase [LDH]: Secondary | ICD-10-CM

## 2018-04-25 DIAGNOSIS — IMO0002 Reserved for concepts with insufficient information to code with codable children: Secondary | ICD-10-CM | POA: Insufficient documentation

## 2018-04-25 DIAGNOSIS — Z0100 Encounter for examination of eyes and vision without abnormal findings: Secondary | ICD-10-CM

## 2018-04-25 DIAGNOSIS — R29818 Other symptoms and signs involving the nervous system: Secondary | ICD-10-CM

## 2018-04-25 DIAGNOSIS — R51 Headache: Secondary | ICD-10-CM

## 2018-04-25 DIAGNOSIS — R7401 Elevation of levels of liver transaminase levels: Secondary | ICD-10-CM | POA: Insufficient documentation

## 2018-04-25 DIAGNOSIS — H538 Other visual disturbances: Secondary | ICD-10-CM | POA: Insufficient documentation

## 2018-04-25 DIAGNOSIS — R519 Headache, unspecified: Secondary | ICD-10-CM

## 2018-04-25 LAB — POCT GLYCOSYLATED HEMOGLOBIN (HGB A1C): HbA1c, POC (controlled diabetic range): 7.1 % — AB (ref 0.0–7.0)

## 2018-04-25 MED ORDER — AMBULATORY NON FORMULARY MEDICATION: 0 refills | Status: DC

## 2018-04-25 MED ORDER — TRAMADOL HCL 50 MG PO TABS: 50.0000 mg | ORAL_TABLET | Freq: Four times a day (QID) | ORAL | 2 refills | Status: DC | PRN

## 2018-04-25 MED ORDER — METFORMIN HCL 500 MG PO TABS: 500.0000 mg | ORAL_TABLET | Freq: Two times a day (BID) | ORAL | 1 refills | Status: DC

## 2018-04-25 NOTE — Progress Notes (Addendum)
HPI:                                                                Jeanette [name] is a 48 y.o. female who presents to [facility]: [place] today for medication management  PrEP: she is still with the same female HIV+ partner, whose viral load is undetectable. She occasionally misses a dose, but overall is very compliant. They use condoms inconsistently. Last HIV antibody Aug 2019 was negative.   HTN: taking Prinzide daily. Compliant with medications. Does not check BP's at home.  Denies chest pain with exertion, orthopnea, syncope and edema. Risk factors include: obesity, family history  DMII: new diagnosis 3 months ago. She was started on Metformin. Compliant with medications.  Denies polydipsia, polyuria, polyphagia. Denies blurred vision or vision change.  Denies ulcers/wounds on feet.She has chronic peripheral neuropathy from lumbar spinal stenosis.  Glucometer: OTC Blood glucose readings: 120's on average Hypoglycemia frequency: none  Headaches: new onset. reports she is having 3-4 headaches per week for a couple of months. Located across forehead and between the eyes. Described as pressure sometimes pounding, moderate in severity. Does not get worse with activity. Associated with blurred vision and dizziness. Takes extra strength Tylenol and Ibuprofen as needed, with mild relief. Has been taking these medications 3-4 days per week. Rarely takes Tramadol.  Has had to call out of work once and leave work once due to symptoms.  Currently has a mild headache. No prior diagnosis of migraines or headache disorder.  Reports brother and nephew had a brain tumor.   Past Medical History:  Diagnosis Date  . Abnormal Pap smear of cervix   . Anemia   . Dysmenorrhea   . Hemorrhoids   . Hypertension   . Menorrhagia   . Pelvic adhesions   . Peripheral neuropathy 01/21/2016  . Spinal stenosis at L4-L5 level 03/10/2016  . Tubo-ovarian abscess 10/2015   Past  Surgical History:  Procedure Laterality Date  . APPENDECTOMY    . ENDOMETRIAL ABLATION  08/16/2015  . MANDIBLE SURGERY    . OOPHORECTOMY Right 10/2015   for ovarian abscess   Social History   Tobacco Use  . Smoking status: Former Smoker    Last attempt to quit: 03/24/2015    Years since quitting: 3.0  . Smokeless tobacco: Never Used  Substance Use Topics  . Alcohol use: Yes   family history includes Brain cancer in her brother, brother, and nephew; Diabetes in her brother, father, maternal grandmother, and mother; Diabetes type I in her niece; Heart attack in her father, paternal grandfather, and paternal grandmother; Hyperlipidemia in her father; Hypertension in her father and son; Stroke in her mother.    Review of Systems  Eyes: Negative for visual disturbance.       + blurred vision  Musculoskeletal: Positive for back pain.  Neurological: Positive for dizziness, weakness (right lower extremity), numbness and headaches.       + paresthesias  All other systems reviewed and are negative.    Medications: Current Outpatient Medications  Medication Sig Dispense Refill  . acetaminophen (TYLENOL) 500 MG tablet Take 500 mg by mouth every 6 (six) hours as needed.    Marland Kitchen emtricitabine-tenofovir (TRUVADA) 200-300 MG tablet Take 1 tablet  by mouth daily. 30 tablet 2  . gabapentin (NEURONTIN) 300 MG capsule Take 1-2 capsules (300-600 mg total) by mouth at bedtime. 90 capsule 3  . ibuprofen (ADVIL,MOTRIN) 200 MG tablet Take 600 mg by mouth every 6 (six) hours as needed.    Marland Kitchen lisinopril-hydrochlorothiazide (PRINZIDE,ZESTORETIC) 20-25 MG tablet Take 1 tablet by mouth daily. 90 tablet 0  . metFORMIN (GLUCOPHAGE) 500 MG tablet Take 1 tablet (500 mg total) by mouth 2 (two) times daily with a meal. 180 tablet 1  . traMADol (ULTRAM) 50 MG tablet Take 1 tablet (50 mg total) by mouth every 6 (six) hours as needed for moderate pain or severe pain. Maximum 6 tabs per day. 30 tablet 2  . AMBULATORY  NON FORMULARY MEDICATION Single glucometer with lancets, and test strips. Check morning fasting glucose daily and up to four times daily prn 1 each 0   No current facility-administered medications for this visit.    Allergies  Allergen Reactions  . Other Anaphylaxis    Sunflower oils       Objective:  BP 126/84   Pulse 69   Wt 231 lb (104.8 kg)   BMI 37.28 kg/m  Gen: well-groomed, not ill-appearing, no acute distress, obese female HEENT: head normocephalic, atraumatic; conjunctiva and cornea clear, oropharynx clear, moist mucus membranes; neck supple, no meningeal signs Pulm: Normal work of breathing, normal phonation, clear to auscultation bilaterally CV: Normal rate, regular rhythm, s1 and s2 distinct, no murmurs, clicks or rubs Neuro:  cranial nerves II-XII intact, no nystagmus, normal finger-to-nose, normal heel-to-shin, negative pronator drift, normal rapid alternating movements, DTR's hyporeflexic, normal tone, no tremor MSK: strength 5/5 and symmetric in bilateral upper extremities and left lower extremity, strength 4-/5 in RLE, normal gait and station Mental Status: alert and oriented x 3, speech articulate, and thought processes clear and goal-directed    Lab Results  Component Value Date   HGBA1C 7.1 (A) 04/25/2018     Results for orders placed or performed in visit on 04/25/18 (from the past 72 hour(s))  POCT HgB A1C     Status: Abnormal   Collection Time: 04/25/18  1:50 PM  Result Value Ref Range   Hemoglobin A1C     HbA1c POC (<> result, manual entry)     HbA1c, POC (prediabetic range)     HbA1c, POC (controlled diabetic range) 7.1 (A) 0.0 - 7.0 %   No results found.    Assessment and Plan: 48 y.o. female with   .Diagnoses and all orders for this visit:  Other long term (current) drug therapy -     HIV antibody (with reflex) -     COMPLETE METABOLIC PANEL WITH GFR  Essential hypertension -     COMPLETE METABOLIC PANEL WITH GFR  Controlled type  2 diabetes mellitus without complication, without long-term current use of insulin (HCC) -     AMBULATORY NON FORMULARY MEDICATION; Single glucometer with lancets, and test strips. Check morning fasting glucose daily and up to four times daily prn -     POCT HgB A1C -     metFORMIN (GLUCOPHAGE) 500 MG tablet; Take 1 tablet (500 mg total) by mouth 2 (two) times daily with a meal. -     COMPLETE METABOLIC PANEL WITH GFR  Contact with and (suspected) exposure to human immunodeficiency virus (hiv) -     HIV antibody (with reflex)  Degenerative lumbar spinal stenosis -     traMADol (ULTRAM) 50 MG tablet; Take 1 tablet (50 mg  total) by mouth every 6 (six) hours as needed for moderate pain or severe pain. Maximum 6 tabs per day. -     Ambulatory referral to Orthopedic Surgery  New onset type 2 diabetes mellitus (HCC)  Transaminitis -     COMPLETE METABOLIC PANEL WITH GFR  Other symptoms and signs involving the nervous system -     MR Brain W Wo Contrast; Future  Nonintractable episodic headache, unspecified headache type -     MR Brain W Wo Contrast; Future  Radicular syndrome of right lower extremity -     Ambulatory referral to Orthopedic Surgery  Blurred vision -     Ambulatory referral to Ophthalmology  Diabetic eye exam [facility]) -     Ambulatory referral to Ophthalmology   Type 2 DM Lab Results  Component Value Date   HGBA1C 7.1 (A) 04/25/2018  Increased from 6.7 Admits she has not been adhering to diet. Encouraged to work on limiting carbs and processed foods Cont Metformin 500 mg bid and recheck in 3 months Foot exam UTD Advised to schedule dilated eye exam with ophthalmology. Referral placed today Blood pressure goal less than 130/80 continue ACE inhibitor LDL goal <70, she did not complete fasting lipids ordered in December  PrEP Recheck HIV antibody and renal function today. Now that she is insured, resume Q66month monitoring  New onset headache Suspect this is  analgesic rebound headache Reassuring neuro exam However, given new onset, family hx of brain tumor (?malignancy) and frequency of headaches will obtain MR w/ and w/o to r/o mass Encouraged to schedule eye exam Counseled on general headache measures, including limiting use of pain medications to 15 days/month, sleep hygiene and hydration Headache diary with BP log and glucose log  Patient education and anticipatory guidance given Patient agrees with treatment plan Follow-up in 1 month for headaches or sooner as needed if symptoms worsen or fail to improve  Darlyne Russian PA-C

## 2018-04-25 NOTE — Patient Instructions (Addendum)
Contact [facility] to schedule your appointment with Dr. Lynann Bologna  [phone]   Try to limit use of OTC pain relievers AND Tramadol to 15 days per month Keep a headache log. Would recommend logging your BP and glucose with this as well You will be contacted to schedule your MRI (likely this Sunday)  Other recommendations: Avoid headache triggers. If you're not sure what triggers your headaches, keep a headache diary with details about every headache. Eventually, you may see a pattern. Get enough sleep. Go to bed and wake up at the same time every day - even on weekends. Aim to get at least 7 hours Don't skip meals. Start your day with a healthy breakfast. Eat lunch and dinner at about the same time every day. Stay hydrated. Be sure to drink plenty of water or other uncaffeinated fluids. Exercise regularly. Physical activity causes your body to release chemicals that block pain signals to your brain. With your doctor's OK, choose activities you enjoy - such as walking, swimming or cycling. Reduce stress. Get organized. Simplify your schedule, and plan ahead. Try to stay positive. Lose weight. Obesity can contribute to headache development, so if you need to lose weight, find a program that works for you.  Analgesic Rebound Headache An analgesic rebound headache, sometimes called a medication overuse headache, is a headache that comes after pain medicine (analgesic) taken to treat the original (primary) headache has worn off. Any type of primary headache can return as a rebound headache if a person regularly takes analgesics more than three times a week to treat it. The types of primary headaches that are commonly associated with rebound headaches include:  Migraines.  Headaches that arise from tense muscles in the head and neck area (tension headaches).  Headaches that develop and happen again (recur) on one side of the head and around the eye (cluster headaches). If rebound headaches  continue, they become chronic daily headaches. What are the causes? This condition may be caused by frequent use of:  Over-the-counter medicines such as aspirin, ibuprofen, and acetaminophen.  Sinus relief medicines and other medicines that contain caffeine.  Narcotic pain medicines such as codeine and oxycodone. What are the signs or symptoms? The symptoms of a rebound headache are the same as the symptoms of the original headache. Some of the symptoms of specific types of headaches include: Migraine headache  Pulsing or throbbing pain on one or both sides of the head.  Severe pain that interferes with daily activities.  Pain that is worsened by physical activity.  Nausea, vomiting, or both.  Pain with exposure to bright light, loud noises, or strong smells.  General sensitivity to bright light, loud noises, or strong smells.  Visual changes.  Numbness of one or both arms. Tension headache  Pressure around the head.  Dull, aching head pain.  Pain felt over the front and sides of the head.  Tenderness in the muscles of the head, neck, and shoulders. Cluster headache  Severe pain that begins in or around one eye or temple.  Redness and tearing in the eye on the same side as the pain.  Droopy or swollen eyelid.  One-sided head pain.  Nausea.  Runny nose.  Sweaty, pale facial skin.  Restlessness. How is this diagnosed? This condition is diagnosed by:  Reviewing your medical history. This includes the nature of your primary headaches.  Reviewing the types of pain medicines that you have been using to treat your headaches and how often you take them.  How is this treated? This condition may be treated or managed by:  Discontinuing frequent use of the analgesic medicine. Doing this may worsen your headaches at first, but the pain should eventually become more manageable, less frequent, and less severe.  Seeing a headache specialist. He or she may be able to  help you manage your headaches and help make sure there is not another cause of the headaches.  Using methods of stress relief, such as acupuncture, counseling, biofeedback, and massage. Talk with your health care provider about which methods might be good for you. Follow these instructions at home:  Take over-the-counter and prescription medicines only as told by your health care provider.  Stop the repeated use of pain medicine as told by your health care provider. Stopping can be difficult. Carefully follow instructions from your health care provider.  Avoid triggers that are known to cause your primary headaches.  Keep all follow-up visits as told by your health care provider. This is important. Contact a health care provider if:  You continue to experience headaches after following treatments that your health care provider recommended. Get help right away if:  You develop new headache pain.  You develop headache pain that is different than what you have experienced in the past.  You develop numbness or tingling in your arms or legs.  You develop changes in your speech or vision. This information is not intended to replace advice given to you by your health care provider. Make sure you discuss any questions you have with your health care provider. Document Released: 04/17/2003 Document Revised: 08/15/2015 Document Reviewed: 06/30/2015 Elsevier Interactive Patient Education  2019 [employer].

## 2018-04-26 LAB — COMPLETE METABOLIC PANEL WITH GFR
AG RATIO: 1.7 (calc) (ref 1.0–2.5)
ALT: 37 U/L — AB (ref 6–29)
AST: 25 U/L (ref 10–35)
Albumin: 4.3 g/dL (ref 3.6–5.1)
Alkaline phosphatase (APISO): 69 U/L (ref 31–125)
BUN: 14 mg/dL (ref 7–25)
CO2: 25 mmol/L (ref 20–32)
Calcium: 9.3 mg/dL (ref 8.6–10.2)
Chloride: 107 mmol/L (ref 98–110)
Creat: 0.52 mg/dL (ref 0.50–1.10)
GFR, Est African American: 130 mL/min/{1.73_m2} (ref >=60)
GFR, Est Non African American: 112 mL/min/{1.73_m2} (ref >=60)
Globulin: 2.6 g/dL (calc) (ref 1.9–3.7)
Glucose, Bld: 123 mg/dL — ABNORMAL HIGH (ref 65–99)
Potassium: 4.1 mmol/L (ref 3.5–5.3)
Sodium: 141 mmol/L (ref 135–146)
Total Bilirubin: 0.5 mg/dL (ref 0.2–1.2)
Total Protein: 6.9 g/dL (ref 6.1–8.1)

## 2018-04-26 LAB — HIV ANTIBODY (ROUTINE TESTING W REFLEX): HIV: NONREACTIVE

## 2018-05-01 ENCOUNTER — Other Ambulatory Visit: Payer: BLUE CROSS/BLUE SHIELD

## 2018-05-01 ENCOUNTER — Other Ambulatory Visit: Payer: Self-pay

## 2018-05-01 ENCOUNTER — Other Ambulatory Visit: Payer: Self-pay | Admitting: Physician Assistant

## 2018-05-01 ENCOUNTER — Encounter: Payer: Self-pay | Admitting: Physician Assistant

## 2018-05-01 ENCOUNTER — Ambulatory Visit (INDEPENDENT_AMBULATORY_CARE_PROVIDER_SITE_OTHER): Payer: BLUE CROSS/BLUE SHIELD

## 2018-05-01 DIAGNOSIS — R519 Headache, unspecified: Secondary | ICD-10-CM

## 2018-05-01 DIAGNOSIS — R42 Dizziness and giddiness: Secondary | ICD-10-CM | POA: Diagnosis not present

## 2018-05-01 DIAGNOSIS — R9082 White matter disease, unspecified: Secondary | ICD-10-CM

## 2018-05-01 DIAGNOSIS — R51 Headache: Secondary | ICD-10-CM

## 2018-05-01 DIAGNOSIS — R29818 Other symptoms and signs involving the nervous system: Secondary | ICD-10-CM

## 2018-05-01 HISTORY — DX: White matter disease, unspecified: R90.82

## 2018-05-01 MED ORDER — GADOBENATE DIMEGLUMINE 529 MG/ML IV SOLN
20.0000 mL | Freq: Once | INTRAVENOUS | Status: AC | PRN
  Administered 2018-05-01: 20 mL via INTRAVENOUS

## 2018-05-01 NOTE — Progress Notes (Signed)
Multiple white matter lesions concerning for chronic MS Called and spoke with patient regarding abnormal diagnostic imaging finding and potential diagnosis of MS Referring to Neuro for further eval She would like to stay within [facility] so referral is sent to [facility]

## 2018-05-04 ENCOUNTER — Encounter: Payer: Self-pay | Admitting: Neurology

## 2018-05-08 ENCOUNTER — Other Ambulatory Visit: Payer: Self-pay | Admitting: Physician Assistant

## 2018-05-08 ENCOUNTER — Telehealth: Payer: Self-pay

## 2018-05-08 ENCOUNTER — Telehealth: Payer: Self-pay | Admitting: Neurology

## 2018-05-08 DIAGNOSIS — M5416 Radiculopathy, lumbar region: Secondary | ICD-10-CM | POA: Diagnosis not present

## 2018-05-08 DIAGNOSIS — R9082 White matter disease, unspecified: Secondary | ICD-10-CM

## 2018-05-08 DIAGNOSIS — M545 Low back pain: Secondary | ICD-10-CM | POA: Diagnosis not present

## 2018-05-08 DIAGNOSIS — R51 Headache: Secondary | ICD-10-CM

## 2018-05-08 DIAGNOSIS — R519 Headache, unspecified: Secondary | ICD-10-CM

## 2018-05-08 NOTE — Telephone Encounter (Signed)
Dr. Leta Baptist can you review this urgent internal referral, pt is already scheduled with Dr. Loretta Plume at [facility] but they also put a urgent referral in to [place] .  White matter abnormality on MRI of brain Nonintractable episodic headache, unspecified headache type

## 2018-05-08 NOTE — Telephone Encounter (Signed)
Pt left vm stating that the neuro she was referred to is not accepting new patients.  She is requesting for her referral to go elsewhere. Please advise. -EH/RMA

## 2018-05-08 NOTE — Telephone Encounter (Signed)
New referral placed to [facility]

## 2018-05-10 ENCOUNTER — Other Ambulatory Visit: Payer: Self-pay | Admitting: Orthopedic Surgery

## 2018-05-10 ENCOUNTER — Other Ambulatory Visit (HOSPITAL_COMMUNITY): Payer: Self-pay | Admitting: Orthopedic Surgery

## 2018-05-10 DIAGNOSIS — M5416 Radiculopathy, lumbar region: Secondary | ICD-10-CM

## 2018-05-11 ENCOUNTER — Encounter: Payer: Self-pay | Admitting: Sports Medicine

## 2018-05-11 ENCOUNTER — Telehealth: Payer: Self-pay | Admitting: Physician Assistant

## 2018-05-11 NOTE — Telephone Encounter (Signed)
Pt advised.

## 2018-05-11 NOTE — Telephone Encounter (Signed)
Thank you :)

## 2018-05-11 NOTE — Telephone Encounter (Signed)
Pt called. She is requesting to be written out for a week because she is still having headaches and back pain.  Thanks.

## 2018-05-11 NOTE — Telephone Encounter (Signed)
Done and in my box

## 2018-05-11 NOTE — Telephone Encounter (Signed)
Dr T:  This letter is incomplete. Can you go back into the  letter and add "office" after "my".. Thanks

## 2018-05-11 NOTE — Telephone Encounter (Signed)
Done

## 2018-05-14 ENCOUNTER — Other Ambulatory Visit: Payer: Self-pay

## 2018-05-14 ENCOUNTER — Ambulatory Visit (INDEPENDENT_AMBULATORY_CARE_PROVIDER_SITE_OTHER): Payer: BLUE CROSS/BLUE SHIELD

## 2018-05-14 DIAGNOSIS — M5117 Intervertebral disc disorders with radiculopathy, lumbosacral region: Secondary | ICD-10-CM | POA: Diagnosis not present

## 2018-05-14 DIAGNOSIS — M545 Low back pain: Secondary | ICD-10-CM | POA: Diagnosis not present

## 2018-05-14 DIAGNOSIS — M5416 Radiculopathy, lumbar region: Secondary | ICD-10-CM

## 2018-05-15 NOTE — Telephone Encounter (Signed)
Ok to setup video consult. -VRP

## 2018-05-16 ENCOUNTER — Encounter: Payer: Self-pay | Admitting: Neurology

## 2018-05-16 NOTE — Telephone Encounter (Signed)
Received call form patient; reviewed EMR, updated for video visit tomorrow. She had no questions, verbalized understanding, appreciation. Marland Kitchen

## 2018-05-17 ENCOUNTER — Ambulatory Visit (INDEPENDENT_AMBULATORY_CARE_PROVIDER_SITE_OTHER): Payer: BLUE CROSS/BLUE SHIELD | Admitting: Physician Assistant

## 2018-05-17 ENCOUNTER — Ambulatory Visit: Payer: BLUE CROSS/BLUE SHIELD | Admitting: Osteopathic Medicine

## 2018-05-17 ENCOUNTER — Encounter: Payer: Self-pay | Admitting: Diagnostic Neuroimaging

## 2018-05-17 ENCOUNTER — Other Ambulatory Visit: Payer: Self-pay

## 2018-05-17 ENCOUNTER — Ambulatory Visit (INDEPENDENT_AMBULATORY_CARE_PROVIDER_SITE_OTHER): Payer: BLUE CROSS/BLUE SHIELD | Admitting: Diagnostic Neuroimaging

## 2018-05-17 ENCOUNTER — Encounter: Payer: Self-pay | Admitting: Physician Assistant

## 2018-05-17 VITALS — BP 122/65 | Wt 224.0 lb

## 2018-05-17 DIAGNOSIS — Z791 Long term (current) use of non-steroidal anti-inflammatories (NSAID): Secondary | ICD-10-CM

## 2018-05-17 DIAGNOSIS — M48061 Spinal stenosis, lumbar region without neurogenic claudication: Secondary | ICD-10-CM

## 2018-05-17 DIAGNOSIS — R9089 Other abnormal findings on diagnostic imaging of central nervous system: Secondary | ICD-10-CM | POA: Diagnosis not present

## 2018-05-17 DIAGNOSIS — R200 Anesthesia of skin: Secondary | ICD-10-CM | POA: Diagnosis not present

## 2018-05-17 DIAGNOSIS — M5416 Radiculopathy, lumbar region: Secondary | ICD-10-CM

## 2018-05-17 MED ORDER — TRAMADOL HCL 50 MG PO TABS: 50.0000 mg | ORAL_TABLET | Freq: Four times a day (QID) | ORAL | 2 refills | Status: DC | PRN

## 2018-05-17 MED ORDER — CELECOXIB 100 MG PO CAPS: 100.0000 mg | ORAL_CAPSULE | Freq: Two times a day (BID) | ORAL | 3 refills | Status: DC

## 2018-05-17 MED ORDER — OMEPRAZOLE 20 MG PO CPDR: 20.0000 mg | DELAYED_RELEASE_CAPSULE | Freq: Every day | ORAL | 1 refills | Status: DC

## 2018-05-17 MED ORDER — GABAPENTIN 300 MG PO CAPS: 300.0000 mg | ORAL_CAPSULE | Freq: Every day | ORAL | 3 refills | Status: DC

## 2018-05-17 NOTE — Progress Notes (Signed)
[facility]  PATIENT: Jeanette [name] DOB: 09/22/1970  REFERRING CLINICIAN: Rennie Plowman HISTORY FROM: patient  REASON FOR VISIT: new consult (video)   HISTORICAL  CHIEF COMPLAINT:  Chief Complaint  Patient presents with  . Numbness  . abnormal MRI brain  . Headache    HISTORY OF PRESENT ILLNESS:   48 year old female here for evaluation of headache, dizziness, abnormal MRI of the brain.  History of hypertension and diabetes on medication. History of PrEP / truvada.   2018 patient had onset of low back pain radiating to the right leg.  She was diagnosed with severe spinal stenosis.  This is managed conservatively.  This is continued to get worse over time.  She has numbness or weakness of her right leg.  In 2019 patient had onset of numbness around her right rib cage area, near her bra line on the right side, which has been persistent.  December 2019 patient had onset of nonspecific pressure headaches in the front and back of her head, pressure behind her eyes.  No nausea or vomiting.  No sensitivity light or sound.  No prior similar headaches.  January 2020 patient had onset of intermittent blurred vision, intermittent dizziness and balance difficulty, leaning to the left side.  As a result of the symptoms patient went to PCP and had MRI of the brain ordered.  This showed multiple round and ovoid periventricular, subcortical, T2 hyperintensities.  Facilitated diffusion and T1 black holes were noted.  Therefore possibility of underlying demyelinating disease or multiple sclerosis was raised.  No abnormal enhancing lesions were noted.    REVIEW OF SYSTEMS: Full 14 system review of systems performed and negative with exception of: Only as per HPI.   ALLERGIES: Allergies  Allergen Reactions  . Other Anaphylaxis    Sunflower oils    HOME MEDICATIONS: Outpatient Medications Prior to Visit  Medication Sig Dispense Refill  . acetaminophen (TYLENOL) 500 MG  tablet Take 500 mg by mouth every 6 (six) hours as needed.    . AMBULATORY NON FORMULARY MEDICATION Single glucometer with lancets, and test strips. Check morning fasting glucose daily and up to four times daily prn (Patient not taking: Reported on 05/16/2018) 1 each 0  . cyclobenzaprine (FLEXERIL) 5 MG tablet Take 5-10 mg by mouth at bedtime as needed.    Marland Kitchen emtricitabine-tenofovir (TRUVADA) 200-300 MG tablet Take 1 tablet by mouth daily. (Patient not taking: Reported on 05/16/2018) 30 tablet 2  . gabapentin (NEURONTIN) 300 MG capsule Take 1-2 capsules (300-600 mg total) by mouth at bedtime. 90 capsule 3  . ibuprofen (ADVIL,MOTRIN) 200 MG tablet Take 600 mg by mouth every 6 (six) hours as needed.    Marland Kitchen lisinopril-hydrochlorothiazide (PRINZIDE,ZESTORETIC) 20-25 MG tablet Take 1 tablet by mouth daily. 90 tablet 0  . metFORMIN (GLUCOPHAGE) 500 MG tablet Take 1 tablet (500 mg total) by mouth 2 (two) times daily with a meal. 180 tablet 1  . traMADol (ULTRAM) 50 MG tablet Take 1 tablet (50 mg total) by mouth every 6 (six) hours as needed for moderate pain or severe pain. Maximum 6 tabs per day. 30 tablet 2   No facility-administered medications prior to visit.     PAST MEDICAL HISTORY: Past Medical History:  Diagnosis Date  . Abnormal Pap smear of cervix   . Anemia   . Dysmenorrhea   . Hemorrhoids   . Hypertension   . Menorrhagia   . Pelvic adhesions   . Peripheral neuropathy 01/21/2016  . Spinal stenosis  at L4-L5 level 03/10/2016  . Tubo-ovarian abscess 10/2015  . White matter abnormality on MRI of brain 05/01/2018    PAST SURGICAL HISTORY: Past Surgical History:  Procedure Laterality Date  . APPENDECTOMY    . ENDOMETRIAL ABLATION  08/16/2015  . MANDIBLE SURGERY    . OOPHORECTOMY Right 10/2015   for ovarian abscess    FAMILY HISTORY: Family History  Problem Relation Age of Onset  . Diabetes Mother   . Stroke Mother   . Diabetes Father   . Hyperlipidemia Father   . Hypertension  Father   . Heart attack Father   . Diabetes Brother   . Hypertension Son   . Heart attack Paternal Grandmother   . Heart attack Paternal Grandfather   . Brain cancer Brother   . Brain cancer Nephew   . Diabetes type I Niece   . Rickets Sister     SOCIAL HISTORY: Social History   Socioeconomic History  . Marital status: Significant Other    Spouse name: Not on file  . Number of children: 5  . Years of education: Not on file  . Highest education level: Some college, no degree  Occupational History    Comment: [employer], [employer]  Social Needs  . Financial resource strain: Not on file  . Food insecurity:    Worry: Not on file    Inability: Not on file  . Transportation needs:    Medical: Not on file    Non-medical: Not on file  Tobacco Use  . Smoking status: Former Smoker    Last attempt to quit: 03/24/2015    Years since quitting: 3.1  . Smokeless tobacco: Never Used  Substance and Sexual Activity  . Alcohol use: Yes    Comment: social  . Drug use: No  . Sexual activity: Yes    Birth control/protection: Condom  Lifestyle  . Physical activity:    Days per week: Not on file    Minutes per session: Not on file  . Stress: Not on file  Relationships  . Social connections:    Talks on phone: Not on file    Gets together: Not on file    Attends religious service: Not on file    Active member of club or organization: Not on file    Attends meetings of clubs or organizations: Not on file    Relationship status: Not on file  . Intimate partner violence:    Fear of current or ex partner: Not on file    Emotionally abused: Not on file    Physically abused: Not on file    Forced sexual activity: Not on file  Other Topics Concern  . Not on file  Social History Narrative   Recently relocated from [place] with her fiance   Caffeine- none     PHYSICAL EXAM  Video exam (LIMITED AS PATIENT IS ALONE AND USING SMARTPHONE CAMERA)  GENERAL  EXAM/CONSTITUTIONAL:  Vitals: There were no vitals filed for this visit.  There is no height or weight on file to calculate BMI. Wt Readings from Last 3 Encounters:  04/25/18 231 lb (104.8 kg)  02/09/18 229 lb (103.9 kg)  01/09/18 228 lb (103.4 kg)     Patient is in no distress; well developed, nourished and groomed; neck is supple   NEUROLOGIC: MENTAL STATUS:  No flowsheet data found.  awake, alert, oriented to person, place and time  recent and remote memory intact  normal attention and concentration  language fluent,  comprehension intact, naming intact  fund of knowledge appropriate  CRANIAL NERVE:   2nd, 3rd, 4th, 6th - visual fields full to confrontation, extraocular muscles intact, no nystagmus  7th - facial strength symmetric  8th - hearing intact  11th - shoulder shrug symmetric  12th - tongue protrusion midline  MOTOR:   NO TREMOR; NO DRIFT IN BUE   COORDINATION:   fine finger movements --> NORMAL   DIAGNOSTIC DATA (LABS, IMAGING, TESTING) - I reviewed patient records, labs, notes, testing and imaging myself where available.  Lab Results  Component Value Date   WBC 3.8 01/21/2016   HGB 14.1 01/21/2016   HCT 39.7 01/21/2016   MCV 87.4 01/21/2016   PLT 280 01/21/2016      Component Value Date/Time   NA 141 04/25/2018 1406   K 4.1 04/25/2018 1406   CL 107 04/25/2018 1406   CO2 25 04/25/2018 1406   GLUCOSE 123 (H) 04/25/2018 1406   BUN 14 04/25/2018 1406   CREATININE 0.52 04/25/2018 1406   CALCIUM 9.3 04/25/2018 1406   PROT 6.9 04/25/2018 1406   ALBUMIN 4.2 01/21/2016 0809   AST 25 04/25/2018 1406   ALT 37 (H) 04/25/2018 1406   ALKPHOS 62 01/21/2016 0809   BILITOT 0.5 04/25/2018 1406   GFRNONAA 112 04/25/2018 1406   GFRAA 130 04/25/2018 1406   Lab Results  Component Value Date   CHOL 182 01/21/2016   HDL 48 (L) 01/21/2016   LDLCALC 121 (H) 01/21/2016   TRIG 67 01/21/2016   CHOLHDL 3.8 01/21/2016   Lab Results  Component  Value Date   HGBA1C 7.1 (A) 04/25/2018   Lab Results  Component Value Date   VITAMINB12 310 01/21/2016   Lab Results  Component Value Date   TSH 0.49 01/21/2016    05/01/18 MRI BRAIN [I reviewed images myself and agree with interpretation. -VRP]  - Periventricular greater than subcortical white matter lesions, without restricted diffusion or postcontrast enhancement, concerning for chronic multiple sclerosis. - No acute intracranial abnormality. No abnormal postcontrast enhancement. - No finding is identified which might contribute to headache.  05/14/18 MRI lumbar spine [I reviewed images myself and agree with interpretation. -VRP]  1. Diffuse lumbar spine spondylosis as described above. 2. At L4-5 there is a broad-based disc bulge with a broad central disc protrusion. Severe bilateral facet arthropathy. Severe spinal stenosis. Mild right foraminal stenosis. 3.  No acute osseous injury of the lumbar spine.    ASSESSMENT AND PLAN  48 y.o. year old female here with new onset headache, blurred vision, dizziness, numbness, abnormal MRI of the brain.  We will proceed with further work-up of possible demyelinating disease.   Dx: headache, blurred vision, dizziness, numbness, abnl MRI  1. Numbness   2. MRI of brain abnormal     Virtual Visit via Video Note  I connected with Jeanette [name] on 05/17/18 at  9:00 AM EDT by a video enabled telemedicine application and verified that I am speaking with the correct person using two identifiers.   I discussed the limitations of evaluation and management by telemedicine and the availability of in person appointments. The patient expressed understanding and agreed to proceed.   I discussed the assessment and treatment plan with the patient. The patient was provided an opportunity to ask questions and all were answered. The patient agreed with the plan and demonstrated an understanding of the instructions.   The patient was advised to call back or  seek an in-person evaluation if the symptoms  worsen or if the condition fails to improve as anticipated.  I provided 30 minutes of non-face-to-face time during this encounter.   PLAN:  - check MRI cervical and thoracic spine w/wo (eval for demyelinating disease) - then may check labs (autoimmune, inflamm, vascular, para-infectious) - then may consider lumbar puncture  Orders Placed This Encounter  Procedures  . MR CERVICAL SPINE W WO CONTRAST  . MR THORACIC SPINE W WO CONTRAST   Return pending test results, for pending if symptoms worsen or fail to improve.    Penni Bombard, MD 05/18/3239, 9:91 AM Certified in Neurology, Neurophysiology and Neuroimaging  [facility] [address] [place], [place] [address] [phone] a

## 2018-05-17 NOTE — Patient Instructions (Signed)
Fall Prevention in the Home, Adult  Falls can cause injuries and can affect people from all age groups. There are many simple things that you can do to make your home safe and to help prevent falls. Ask for help when making these changes, if needed.  What actions can I take to prevent falls?  General instructions  · Use good lighting in all rooms. Replace any light bulbs that burn out.  · Turn on lights if it is dark. Use night-lights.  · Place frequently used items in easy-to-reach places. Lower the shelves around your home if necessary.  · Set up furniture so that there are clear paths around it. Avoid moving your furniture around.  · Remove throw rugs and other tripping hazards from the floor.  · Avoid walking on wet floors.  · Fix any uneven floor surfaces.  · Add color or contrast paint or tape to grab bars and handrails in your home. Place contrasting color strips on the first and last steps of stairways.  · When you use a stepladder, make sure that it is completely opened and that the sides are firmly locked. Have someone hold the ladder while you are using it. Do not climb a closed stepladder.  · Be aware of any and all pets.  What can I do in the bathroom?         · Keep the floor dry. Immediately clean up any water that spills onto the floor.  · Remove soap buildup in the tub or shower on a regular basis.  · Use non-skid mats or decals on the floor of the tub or shower.  · Attach bath mats securely with double-sided, non-slip rug tape.  · If you need to sit down while you are in the shower, use a plastic, non-slip stool.  · Install grab bars by the toilet and in the tub and shower. Do not use towel bars as grab bars.  What can I do in the bedroom?  · Make sure that a bedside light is easy to reach.  · Do not use oversized bedding that drapes onto the floor.  · Have a firm chair that has side arms to use for getting dressed.  What can I do in the kitchen?  · Clean up any spills right away.  · If you need to  reach for something above you, use a sturdy step stool that has a grab bar.  · Keep electrical cables out of the way.  · Do not use floor polish or wax that makes floors slippery. If you must use wax, make sure that it is non-skid floor wax.  What can I do in the stairways?  · Do not leave any items on the stairs.  · Make sure that you have a light switch at the top of the stairs and the bottom of the stairs. Have them installed if you do not have them.  · Make sure that there are handrails on both sides of the stairs. Fix handrails that are broken or loose. Make sure that handrails are as long as the stairways.  · Install non-slip stair treads on all stairs in your home.  · Avoid having throw rugs at the top or bottom of stairways, or secure the rugs with carpet tape to prevent them from moving.  · Choose a carpet design that does not hide the edge of steps on the stairway.  · Check any carpeting to make sure that it is firmly attached   to the stairs. Fix any carpet that is loose or worn.  What can I do on the outside of my home?  · Use bright outdoor lighting.  · Regularly repair the edges of walkways and driveways and fix any cracks.  · Remove high doorway thresholds.  · Trim any shrubbery on the main path into your home.  · Regularly check that handrails are securely fastened and in good repair. Both sides of any steps should have handrails.  · Install guardrails along the edges of any raised decks or porches.  · Clear walkways of debris and clutter, including tools and rocks.  · Have leaves, snow, and ice cleared regularly.  · Use sand or salt on walkways during winter months.  · In the garage, clean up any spills right away, including grease or oil spills.  What other actions can I take?  · Wear closed-toe shoes that fit well and support your feet. Wear shoes that have rubber soles or low heels.  · Use mobility aids as needed, such as canes, walkers, scooters, and crutches.  · Review your medicines with your  health care provider. Some medicines can cause dizziness or changes in blood pressure, which increase your risk of falling.  Talk with your health care provider about other ways that you can decrease your risk of falls. This may include working with a physical therapist or trainer to improve your strength, balance, and endurance.  Where to find more information  · Centers for Disease Control and Prevention, STEADI: https://www.cdc.gov  · National Institute on Aging: https://go4life.nia.nih.gov  Contact a health care provider if:  · You are afraid of falling at home.  · You feel weak, drowsy, or dizzy at home.  · You fall at home.  Summary  · There are many simple things that you can do to make your home safe and to help prevent falls.  · Ways to make your home safe include removing tripping hazards and installing grab bars in the bathroom.  · Ask for help when making these changes in your home.  This information is not intended to replace advice given to you by your health care provider. Make sure you discuss any questions you have with your health care provider.  Document Released: 01/15/2002 Document Revised: 09/09/2016 Document Reviewed: 09/09/2016  Elsevier Interactive Patient Education © 2019 Elsevier Inc.

## 2018-05-17 NOTE — Progress Notes (Signed)
Virtual Visit via Video Note  I connected with Jeanette [name] on 05/17/18 at 10:50 AM EDT by a video enabled telemedicine application and verified that I am speaking with the correct person using two identifiers.   I discussed the limitations of evaluation and management by telemedicine and the availability of in person appointments. The patient expressed understanding and agreed to proceed.  History of Present Illness:  Requesting refills of Tramadol, Gabapentin and Meloxicam for back pain due to severe lumbar spinal stenosis. She reports she takes Gabapentin nightly and uses Tramadol as needed for nighttime pain that interferes with sleep. She states Meloxicam is slightly more helpful than Ibuprofen, but does not provide much day-time pain control. She was evaluated by neurosurgery last week and repeated her lumbar MRI.  She is requesting a letter for her employer keeping her out of work for the reminder of the month. She reports she is having worsening balance issues and near falls almost daily. Due to pain and weakness, she does not feel she can perform her job responsibilities.  She was also evaluated by neurology yesterday for persistent headaches and abnormal brain MRI concerning for chronic MS. Neuro is planning imaging of cervical and thoracic spine.   Past Medical History:  Diagnosis Date  . Abnormal Pap smear of cervix   . Anemia   . Dysmenorrhea   . Hemorrhoids   . Hypertension   . Menorrhagia   . Pelvic adhesions   . Peripheral neuropathy 01/21/2016  . Spinal stenosis at L4-L5 level 03/10/2016  . Tubo-ovarian abscess 10/2015  . White matter abnormality on MRI of brain 05/01/2018   Past Surgical History:  Procedure Laterality Date  . APPENDECTOMY    . ENDOMETRIAL ABLATION  08/16/2015  . MANDIBLE SURGERY    . OOPHORECTOMY Right 10/2015   for ovarian abscess   Social History   Tobacco Use  . Smoking status: Former Smoker    Last attempt to quit: 03/24/2015    Years since  quitting: 3.1  . Smokeless tobacco: Never Used  Substance Use Topics  . Alcohol use: Yes    Comment: social   family history includes Brain cancer in her brother and nephew; Diabetes in her brother, father, and mother; Diabetes type I in her niece; Heart attack in her father, paternal grandfather, and paternal grandmother; Hyperlipidemia in her father; Hypertension in her father and son; Rickets in her sister; Stroke in her mother.  Review of Systems  Gastrointestinal: Positive for abdominal pain.       + reflux  Musculoskeletal: Positive for arthralgias, back pain and gait problem (imbalance, leaning toward left side).  Neurological: Positive for weakness, numbness and headaches.  All other systems reviewed and are negative.    Medications: Current Outpatient Medications  Medication Sig Dispense Refill  . emtricitabine-tenofovir (TRUVADA) 200-300 MG tablet Take 1 tablet by mouth daily. 30 tablet 2  . gabapentin (NEURONTIN) 300 MG capsule Take 1-2 capsules (300-600 mg total) by mouth at bedtime. 60 capsule 3  . lisinopril-hydrochlorothiazide (PRINZIDE,ZESTORETIC) 20-25 MG tablet Take 1 tablet by mouth daily. 90 tablet 0  . metFORMIN (GLUCOPHAGE) 500 MG tablet Take 1 tablet (500 mg total) by mouth 2 (two) times daily with a meal. 180 tablet 1  . traMADol (ULTRAM) 50 MG tablet Take 1 tablet (50 mg total) by mouth every 6 (six) hours as needed for moderate pain or severe pain. Maximum 6 tabs per day. 30 tablet 2  . acetaminophen (TYLENOL) 500 MG tablet Take 500 mg by  mouth every 6 (six) hours as needed.    . AMBULATORY NON FORMULARY MEDICATION Single glucometer with lancets, and test strips. Check morning fasting glucose daily and up to four times daily prn (Patient not taking: Reported on 05/16/2018) 1 each 0  . celecoxib (CELEBREX) 100 MG capsule Take 1 capsule (100 mg total) by mouth 2 (two) times daily with a meal. 60 capsule 3  . cyclobenzaprine (FLEXERIL) 5 MG tablet Take 5-10 mg by  mouth at bedtime as needed.    Marland Kitchen omeprazole (PRILOSEC) 20 MG capsule Take 1 capsule (20 mg total) by mouth daily before breakfast. 90 capsule 1   No current facility-administered medications for this visit.    Allergies  Allergen Reactions  . Other Anaphylaxis    Sunflower oils       Objective:  BP 122/65   Wt 224 lb (101.6 kg)   BMI 36.15 kg/m  Gen:  alert, not ill-appearing, no distress, appropriate for age 48: head normocephalic without obvious abnormality, conjunctiva and cornea clear, trachea midline Pulm: Normal work of breathing, normal phonation Neuro: alert and oriented x 3     No results found for this or any previous visit (from the past 72 hour(s)). Mr Lumbar Spine Wo Contrast  Result Date: 05/14/2018 CLINICAL DATA:  Low back pain EXAM: MRI LUMBAR SPINE WITHOUT CONTRAST TECHNIQUE: Multiplanar, multisequence MR imaging of the lumbar spine was performed. No intravenous contrast was administered. COMPARISON:  02/23/2016 FINDINGS: Segmentation:  Standard. Alignment:  Physiologic. Vertebrae:  No fracture, evidence of discitis, or bone lesion. Conus medullaris and cauda equina: Conus extends to the T2 level. Conus and cauda equina appear normal. Paraspinal and other soft tissues: No acute paraspinal abnormality. Disc levels: Degenerative disc disease with disc height loss at L4-5. Mild disc height loss at L2-3 and L3-4. T12-L1: Mild broad-based disc bulge. No evidence of neural foraminal stenosis. No central canal stenosis. L1-L2: No significant disc bulge. No evidence of neural foraminal stenosis. No central canal stenosis. Mild bilateral facet arthropathy. L2-L3: Mild broad-based disc bulge. Mild bilateral facet arthropathy. No evidence of neural foraminal stenosis. No central canal stenosis. L3-L4: Broad-based disc bulge flattening the ventral thecal sac and a tiny central disc protrusion. Mild bilateral facet arthropathy. No evidence of neural foraminal stenosis. No central  canal stenosis. L4-L5: Broad-based disc bulge with a broad central disc protrusion. Severe bilateral facet arthropathy. Severe spinal stenosis. Mild right foraminal stenosis. No left foraminal stenosis. L5-S1: No significant disc bulge. No evidence of neural foraminal stenosis. No central canal stenosis. Moderate bilateral facet arthropathy. IMPRESSION: 1. Diffuse lumbar spine spondylosis as described above. 2. At L4-5 there is a broad-based disc bulge with a broad central disc protrusion. Severe bilateral facet arthropathy. Severe spinal stenosis. Mild right foraminal stenosis. 3.  No acute osseous injury of the lumbar spine. Electronically Signed   By: Kathreen Devoid   On: 05/14/2018 13:21      Assessment and Plan: 48 y.o. female with  .Diagnoses and all orders for this visit:  NSAID long-term use -     omeprazole (PRILOSEC) 20 MG capsule; Take 1 capsule (20 mg total) by mouth daily before breakfast.  Degenerative lumbar spinal stenosis -     traMADol (ULTRAM) 50 MG tablet; Take 1 tablet (50 mg total) by mouth every 6 (six) hours as needed for moderate pain or severe pain. Maximum 6 tabs per day. -     celecoxib (CELEBREX) 100 MG capsule; Take 1 capsule (100 mg total) by mouth 2 (  two) times daily with a meal.  Lumbar radicular pain -     gabapentin (NEURONTIN) 300 MG capsule; Take 1-2 capsules (300-600 mg total) by mouth at bedtime.   Refills provided as above Switching from Meloxicam to Celebrex Adding Omeprazole for GI prophylaxis Work note provided as well Education on falls prevention provided in patient instructions Keep follow-up with neurosurgery and neurology  Follow-up in 2 months for diabetes/HTN  Follow Up Instructions:    I discussed the assessment and treatment plan with the patient. The patient was provided an opportunity to ask questions and all were answered. The patient agreed with the plan and demonstrated an understanding of the instructions.   The patient was  advised to call back or seek an in-person evaluation if the symptoms worsen or if the condition fails to improve as anticipated.  I provided 15 minutes of non-face-to-face time during this encounter.   Trixie Dredge, [place]

## 2018-05-18 ENCOUNTER — Encounter: Payer: Self-pay | Admitting: Physician Assistant

## 2018-05-18 DIAGNOSIS — M5416 Radiculopathy, lumbar region: Secondary | ICD-10-CM | POA: Insufficient documentation

## 2018-05-18 DIAGNOSIS — Z791 Long term (current) use of non-steroidal anti-inflammatories (NSAID): Secondary | ICD-10-CM | POA: Insufficient documentation

## 2018-05-22 ENCOUNTER — Telehealth: Payer: Self-pay | Admitting: Diagnostic Neuroimaging

## 2018-05-22 NOTE — Telephone Encounter (Signed)
[employer] Auth: [id number] (exp. 05/22/18 to 11/17/18)  I spoke to [place] at [employer].. they are now scheduling MRI's out pass June 1st. Do you think this can wait until after June 1 or if you believe she needs to have it before then they can schedule her a little sooner than that.

## 2018-05-22 NOTE — Telephone Encounter (Signed)
I recommend patient get scans done sooner. Please inquire about [place] [facility] also and let patient know (it was her preference for [facility] location). -VRP

## 2018-05-22 NOTE — Telephone Encounter (Signed)
Noted, [facility] is the same way they have now changed their policy to schedule after June. 1st  But [facility] can get her in sooner than June 1st

## 2018-05-23 DIAGNOSIS — M5416 Radiculopathy, lumbar region: Secondary | ICD-10-CM | POA: Diagnosis not present

## 2018-05-23 NOTE — Telephone Encounter (Signed)
Patients MRi's are scheduled for 05/29/18

## 2018-05-29 ENCOUNTER — Other Ambulatory Visit: Payer: Self-pay

## 2018-05-29 ENCOUNTER — Ambulatory Visit (INDEPENDENT_AMBULATORY_CARE_PROVIDER_SITE_OTHER): Payer: BLUE CROSS/BLUE SHIELD

## 2018-05-29 DIAGNOSIS — R9089 Other abnormal findings on diagnostic imaging of central nervous system: Secondary | ICD-10-CM

## 2018-05-29 DIAGNOSIS — R200 Anesthesia of skin: Secondary | ICD-10-CM | POA: Diagnosis not present

## 2018-05-29 DIAGNOSIS — M50221 Other cervical disc displacement at C4-C5 level: Secondary | ICD-10-CM | POA: Diagnosis not present

## 2018-05-29 DIAGNOSIS — M5124 Other intervertebral disc displacement, thoracic region: Secondary | ICD-10-CM | POA: Diagnosis not present

## 2018-05-29 DIAGNOSIS — M50223 Other cervical disc displacement at C6-C7 level: Secondary | ICD-10-CM | POA: Diagnosis not present

## 2018-05-29 DIAGNOSIS — M5416 Radiculopathy, lumbar region: Secondary | ICD-10-CM | POA: Diagnosis not present

## 2018-05-29 MED ORDER — GADOBENATE DIMEGLUMINE 529 MG/ML IV SOLN
20.0000 mL | Freq: Once | INTRAVENOUS | Status: AC | PRN
  Administered 2018-05-29: 20 mL via INTRAVENOUS

## 2018-05-30 ENCOUNTER — Other Ambulatory Visit: Payer: Self-pay | Admitting: Physician Assistant

## 2018-05-30 DIAGNOSIS — I10 Essential (primary) hypertension: Secondary | ICD-10-CM

## 2018-06-05 DIAGNOSIS — M5416 Radiculopathy, lumbar region: Secondary | ICD-10-CM | POA: Diagnosis not present

## 2018-06-21 ENCOUNTER — Ambulatory Visit (INDEPENDENT_AMBULATORY_CARE_PROVIDER_SITE_OTHER): Payer: BLUE CROSS/BLUE SHIELD | Admitting: Sports Medicine

## 2018-06-21 DIAGNOSIS — M170 Bilateral primary osteoarthritis of knee: Secondary | ICD-10-CM | POA: Diagnosis not present

## 2018-06-21 NOTE — Assessment & Plan Note (Signed)
Left knee is doing okay, right knee injected today. Return as needed.

## 2018-06-21 NOTE — Progress Notes (Signed)
Subjective:    CC: Right knee pain  HPI: This is a pleasant 48 year old female, she is having pain in her right knee, medial joint line, severe, persistent, localized without radiation, present for the last couple of weeks.  No mechanical symptoms, no trauma.  Known osteoarthritis.  I reviewed the past medical history, family history, social history, surgical history, and allergies today and no changes were needed.  Please see the problem list section below in epic for further details.  Past Medical History: Past Medical History:  Diagnosis Date  . Abnormal Pap smear of cervix   . Anemia   . Dysmenorrhea   . Hemorrhoids   . Hypertension   . Menorrhagia   . Pelvic adhesions   . Peripheral neuropathy 01/21/2016  . Spinal stenosis at L4-L5 level 03/10/2016  . Tubo-ovarian abscess 10/2015  . White matter abnormality on MRI of brain 05/01/2018   Past Surgical History: Past Surgical History:  Procedure Laterality Date  . APPENDECTOMY    . ENDOMETRIAL ABLATION  08/16/2015  . MANDIBLE SURGERY    . OOPHORECTOMY Right 10/2015   for ovarian abscess   Social History: Social History   Socioeconomic History  . Marital status: Significant Other    Spouse name: Not on file  . Number of children: 5  . Years of education: Not on file  . Highest education level: Some college, no degree  Occupational History    Comment: [employer], [employer]  Social Needs  . Financial resource strain: Not on file  . Food insecurity:    Worry: Not on file    Inability: Not on file  . Transportation needs:    Medical: Not on file    Non-medical: Not on file  Tobacco Use  . Smoking status: Former Smoker    Last attempt to quit: 03/24/2015    Years since quitting: 3.2  . Smokeless tobacco: Never Used  Substance and Sexual Activity  . Alcohol use: Yes    Comment: social  . Drug use: No  . Sexual activity: Yes    Birth control/protection: Condom  Lifestyle  . Physical activity:   Days per week: Not on file    Minutes per session: Not on file  . Stress: Not on file  Relationships  . Social connections:    Talks on phone: Not on file    Gets together: Not on file    Attends religious service: Not on file    Active member of club or organization: Not on file    Attends meetings of clubs or organizations: Not on file    Relationship status: Not on file  Other Topics Concern  . Not on file  Social History Narrative   Recently relocated from [place] with her fiance   Caffeine- none   Family History: Family History  Problem Relation Age of Onset  . Diabetes Mother   . Stroke Mother   . Diabetes Father   . Hyperlipidemia Father   . Hypertension Father   . Heart attack Father   . Diabetes Brother   . Hypertension Son   . Heart attack Paternal Grandmother   . Heart attack Paternal Grandfather   . Brain cancer Brother   . Brain cancer Nephew   . Diabetes type I Niece   . Rickets Sister    Allergies: Allergies  Allergen Reactions  . Other Anaphylaxis    Sunflower oils   Medications: See med rec.  Review of Systems: No fevers, chills, night sweats,  weight loss, chest pain, or shortness of breath.   Objective:    General: Well Developed, well nourished, and in no acute distress.  Neuro: Alert and oriented x3, extra-ocular muscles intact, sensation grossly intact.  HEENT: Normocephalic, atraumatic, pupils equal round reactive to light, neck supple, no masses, no lymphadenopathy, thyroid nonpalpable.  Skin: Warm and dry, no rashes. Cardiac: Regular rate and rhythm, no murmurs rubs or gallops, no lower extremity edema.  Respiratory: Clear to auscultation bilaterally. Not using accessory muscles, speaking in full sentences. Right knee: Mild swelling, tenderness at the medial joint line ROM normal in flexion and extension and lower leg rotation. Ligaments with solid consistent endpoints including ACL, PCL, LCL, MCL. Negative Mcmurray's and provocative  meniscal tests. Non painful patellar compression. Patellar and quadriceps tendons unremarkable. Hamstring and quadriceps strength is normal.  Procedure: Real-time Ultrasound Guided injection of the right knee Device: GE Logiq E  Verbal informed consent obtained.  Time-out conducted.  Noted no overlying erythema, induration, or other signs of local infection.  Skin prepped in a sterile fashion.  Local anesthesia: Topical Ethyl chloride.  With sterile technique and under real time ultrasound guidance:  1 cc Kenalog 40, 2 cc lidocaine, 2 cc bupivacaine injected easily Completed without difficulty  Pain immediately resolved suggesting accurate placement of the medication.  Advised to call if fevers/chills, erythema, induration, drainage, or persistent bleeding.  Images permanently stored and available for review in the ultrasound unit.  Impression: Technically successful ultrasound guided injection.  Impression and Recommendations:    Primary osteoarthritis of both knees Left knee is doing okay, right knee injected today. Return as needed.   ___________________________________________ Gwen Her. Dianah Field, M.D., ABFM., CAQSM. Primary Care and Sports Medicine [facility]  Adjunct Professor of [employer] [facility] [employer]

## 2018-06-22 ENCOUNTER — Telehealth: Payer: Self-pay | Admitting: Diagnostic Neuroimaging

## 2018-06-22 DIAGNOSIS — R9089 Other abnormal findings on diagnostic imaging of central nervous system: Secondary | ICD-10-CM

## 2018-06-22 NOTE — Telephone Encounter (Signed)
I called patient to review symptoms and MRI results. No answer. Will try again later. -VRP

## 2018-06-26 NOTE — Telephone Encounter (Signed)
I called patient and discussed results. Will check MS mimicking labs. She discussed lumbar and thoracic MRI findings with spine surgeon and will continue treatments with them. She may need surgery in next 1-2 months.   After labs tests, may start tecfidera vs copaxone / interferon.  Will order labs to be done in [facility].  Orders Placed This Encounter  Procedures  . ANA,IFA RA Diag Pnl w/rflx Tit/Patn  . Angiotensin converting enzyme  . Pan-ANCA  . Varicella Zoster Antibody, IgG  . Hepatitis B Core AB, Total  . Hep B Surface Antibody  . Hep B Surface Antigen  . Hep C Antibody  . QuantiFERON-TB Gold Plus  . Stratify JCV(TM) Ab w/Index  . CBC with Differential/Platelet  . Comprehensive metabolic panel     Penni Bombard, MD 0/67/7034, 0:35 AM Certified in Neurology, Neurophysiology and Neuroimaging  [facility] [address], [place], [place] [address] [phone]

## 2018-06-26 NOTE — Telephone Encounter (Addendum)
[place] lab, unable to speak to rep after 10 min hold. Per website, lab provider is [facility]. Jeanette [name] in our office will release labs. I faxed lab orders to [facility] lab,  F [phone] in event they cannot see or use released labs. Called patient to advise lab orders faxed to [facility].  She stated she'll be in [facility] and would prefer to come into this office. I advised she not come during lunch hour; she verbalized understanding, appreciation. She stated she'll be here around 11 am.  Lab orders given to [place] to draw Wed.

## 2018-06-26 NOTE — Addendum Note (Signed)
Addended by: Inis Sizer D on: 06/26/2018 09:52 AM   Modules accepted: Orders

## 2018-06-28 ENCOUNTER — Other Ambulatory Visit (INDEPENDENT_AMBULATORY_CARE_PROVIDER_SITE_OTHER): Payer: Self-pay

## 2018-06-28 ENCOUNTER — Other Ambulatory Visit: Payer: Self-pay

## 2018-06-28 DIAGNOSIS — Z0289 Encounter for other administrative examinations: Secondary | ICD-10-CM

## 2018-06-28 DIAGNOSIS — R9089 Other abnormal findings on diagnostic imaging of central nervous system: Secondary | ICD-10-CM | POA: Diagnosis not present

## 2018-06-28 DIAGNOSIS — M5416 Radiculopathy, lumbar region: Secondary | ICD-10-CM | POA: Diagnosis not present

## 2018-06-28 NOTE — Telephone Encounter (Signed)
Patient came into office today and had labs drawn. JCV specimen with documents placed in [facility] lock box.

## 2018-06-29 NOTE — Telephone Encounter (Signed)
Received fax from [place] requesting additional information re: JCV. Not clear as to what they need. I called, spoke with Rudell Cobb who stated they received a [employer] [facility] requisition. I advised I included [facility] lab form with specimen. She asked what test are specifically needed from [place]. I advised only JCV index w/pos-neg results.  She stated she would take care of it, verbalized understanding of call.

## 2018-07-04 LAB — HM DIABETES EYE EXAM

## 2018-07-05 LAB — CBC WITH DIFFERENTIAL/PLATELET
Basophils Absolute: 0 10*3/uL (ref 0.0–0.2)
Basos: 1 %
EOS (ABSOLUTE): 0.2 10*3/uL (ref 0.0–0.4)
Eos: 3 %
Hematocrit: 38.1 % (ref 34.0–46.6)
Hemoglobin: 13 g/dL (ref 11.1–15.9)
Immature Grans (Abs): 0 10*3/uL (ref 0.0–0.1)
Immature Granulocytes: 0 %
Lymphocytes Absolute: 2.1 10*3/uL (ref 0.7–3.1)
Lymphs: 44 %
MCH: 31 pg (ref 26.6–33.0)
MCHC: 34.1 g/dL (ref 31.5–35.7)
MCV: 91 fL (ref 79–97)
Monocytes Absolute: 0.3 10*3/uL (ref 0.1–0.9)
Monocytes: 5 %
Neutrophils Absolute: 2.2 10*3/uL (ref 1.4–7.0)
Neutrophils: 47 %
Platelets: 235 10*3/uL (ref 150–450)
RBC: 4.19 x10E6/uL (ref 3.77–5.28)
RDW: 12.5 % (ref 11.7–15.4)
WBC: 4.8 10*3/uL (ref 3.4–10.8)

## 2018-07-05 LAB — ANGIOTENSIN CONVERTING ENZYME: Angio Convert Enzyme: 22 U/L (ref 14–82)

## 2018-07-05 LAB — VARICELLA ZOSTER ANTIBODY, IGG: Varicella zoster IgG: 475 index (ref >165)

## 2018-07-05 LAB — HEPATITIS B SURFACE ANTIGEN: Hepatitis B Surface Ag: NEGATIVE

## 2018-07-05 LAB — PAN-ANCA
ANCA Proteinase 3: <3.5 U/mL (ref 0.0–3.5)
Atypical pANCA: <1:20 {titer}
C-ANCA: <1:20 {titer}
Myeloperoxidase Ab: <9 U/mL (ref 0.0–9.0)
P-ANCA: <1:20 {titer}

## 2018-07-05 LAB — QUANTIFERON-TB GOLD PLUS
QuantiFERON Mitogen Value: >10 IU/mL
QuantiFERON Nil Value: 0.09 IU/mL
QuantiFERON TB1 Ag Value: 0.1 IU/mL
QuantiFERON TB2 Ag Value: 0.06 IU/mL
QuantiFERON-TB Gold Plus: NEGATIVE

## 2018-07-05 LAB — COMPREHENSIVE METABOLIC PANEL
ALT: 91 IU/L — ABNORMAL HIGH (ref 0–32)
AST: 60 IU/L — ABNORMAL HIGH (ref 0–40)
Albumin/Globulin Ratio: 2 (ref 1.2–2.2)
Albumin: 4.3 g/dL (ref 3.8–4.8)
Alkaline Phosphatase: 96 IU/L (ref 39–117)
BUN/Creatinine Ratio: 19 (ref 9–23)
BUN: 10 mg/dL (ref 6–24)
Bilirubin Total: 0.2 mg/dL (ref 0.0–1.2)
CO2: 22 mmol/L (ref 20–29)
Calcium: 9.1 mg/dL (ref 8.7–10.2)
Chloride: 99 mmol/L (ref 96–106)
Creatinine, Ser: 0.54 mg/dL — ABNORMAL LOW (ref 0.57–1.00)
GFR calc Af Amer: 128 mL/min/{1.73_m2} (ref >59)
GFR calc non Af Amer: 111 mL/min/{1.73_m2} (ref >59)
Globulin, Total: 2.1 g/dL (ref 1.5–4.5)
Glucose: 290 mg/dL — ABNORMAL HIGH (ref 65–99)
Potassium: 4.3 mmol/L (ref 3.5–5.2)
Sodium: 135 mmol/L (ref 134–144)
Total Protein: 6.4 g/dL (ref 6.0–8.5)

## 2018-07-05 LAB — STRATIFY JCV(TM) AB W/INDEX
JCV Antibody: POSITIVE — AB
JCV Index Value: 3.35

## 2018-07-05 LAB — HEPATITIS B CORE ANTIBODY, TOTAL: Hep B Core Total Ab: NEGATIVE

## 2018-07-05 LAB — HEPATITIS B SURFACE ANTIBODY,QUALITATIVE: Hep B Surface Ab, Qual: NONREACTIVE

## 2018-07-05 LAB — HEPATITIS C ANTIBODY: Hep C Virus Ab: 0.3 s/co ratio (ref 0.0–0.9)

## 2018-07-05 LAB — ANA,IFA RA DIAG PNL W/RFLX TIT/PATN
ANA Titer 1: NEGATIVE
Cyclic Citrullin Peptide Ab: 9 units (ref 0–19)
Rheumatoid fact SerPl-aCnc: <10 IU/mL (ref 0.0–13.9)

## 2018-07-05 NOTE — Telephone Encounter (Signed)
Received JCV result, result documented in snapshot and placed on Dr [facility] desk for review.

## 2018-07-13 NOTE — Telephone Encounter (Signed)
Called patient and informed her that Dr Leta Baptist would like to have video visit with her to discuss medications. scheduled for next Mon, confirmed e mail. She  verbalized understanding, appreciation. E mail sent.

## 2018-07-13 NOTE — Telephone Encounter (Signed)
pls setup video visit. -VRP

## 2018-07-14 DIAGNOSIS — M5416 Radiculopathy, lumbar region: Secondary | ICD-10-CM | POA: Diagnosis not present

## 2018-07-17 ENCOUNTER — Other Ambulatory Visit: Payer: Self-pay

## 2018-07-17 ENCOUNTER — Encounter (INDEPENDENT_AMBULATORY_CARE_PROVIDER_SITE_OTHER): Payer: Self-pay | Admitting: Diagnostic Neuroimaging

## 2018-07-17 DIAGNOSIS — Z0289 Encounter for other administrative examinations: Secondary | ICD-10-CM

## 2018-07-18 NOTE — Telephone Encounter (Signed)
Called patient to discuss that she did not connect for video visit yesterday. She stated she completely forgot and asked to reschedule. We rescheduled for tomorrow, I explained doxy.me again and that a new e mail will be sent with the link. She  verbalized understanding, appreciation. New e mail sent.

## 2018-07-19 ENCOUNTER — Ambulatory Visit: Payer: BLUE CROSS/BLUE SHIELD | Admitting: Neurology

## 2018-07-19 ENCOUNTER — Ambulatory Visit (INDEPENDENT_AMBULATORY_CARE_PROVIDER_SITE_OTHER): Payer: BC Managed Care – PPO | Admitting: Diagnostic Neuroimaging

## 2018-07-19 ENCOUNTER — Encounter: Payer: Self-pay | Admitting: Diagnostic Neuroimaging

## 2018-07-19 ENCOUNTER — Other Ambulatory Visit: Payer: Self-pay

## 2018-07-19 ENCOUNTER — Telehealth: Payer: Self-pay | Admitting: *Deleted

## 2018-07-19 DIAGNOSIS — M5124 Other intervertebral disc displacement, thoracic region: Secondary | ICD-10-CM | POA: Diagnosis not present

## 2018-07-19 DIAGNOSIS — M48062 Spinal stenosis, lumbar region with neurogenic claudication: Secondary | ICD-10-CM

## 2018-07-19 DIAGNOSIS — G35 Multiple sclerosis: Secondary | ICD-10-CM

## 2018-07-19 NOTE — Telephone Encounter (Signed)
Per Dr Leta Baptist, patient will start Copaxone for MS treatment. Called patient and advised her that a prescription and service request form must be filled out and signed. I advised her that due to Covid 19, I can review the form in it's entirety, and if she agrees, she can give verbal consent to myself and Acupuncturist over the phone. She agreed to this. I reviewed form, answered her questions. She agreed to give verbal consent; call put on speaker. Sandy RN identified herself. The patient identified herself with name and DOB. She again consented to have both RNs present sign Copaxone form on her behalf. I advised her I will mail her a copy once it is completed. She verbalized understanding, appreciation.   Form was signed, dated, placed on Dr [employer] desk for Rx completion, signature.

## 2018-07-19 NOTE — Progress Notes (Signed)
Virtual Visit via Video Note  I connected with Jeanette [name] on 07/19/18 at  1:00 PM EDT by a video enabled telemedicine application and verified that I am speaking with the correct person using two identifiers.   I discussed the limitations of evaluation and management by telemedicine and the availability of in person appointments. The patient expressed understanding and agreed to proceed.  Patient is at their home. I am at the office.    History of Present Illness:  UPDATE (07/19/18, VRP): MS mimic labs ruled out; positive JCV ab; slightly elevated LFTs (unknown cause). Since last visit, doing about the same. Symptoms are stable. Severity is moderate. No alleviating or aggravating factors. Tolerating meds. Labs completed. Now with dx of multiple sclerosis. Planning to start disease modifying therapy.   PRIOR HPI (05/17/18): 48 year old female here for evaluation of headache, dizziness, abnormal MRI of the brain.  History of hypertension and diabetes on medication. History of PrEP / truvada.   2018 patient had onset of low back pain radiating to the right leg.  She was diagnosed with severe spinal stenosis.  This is managed conservatively.  This is continued to get worse over time.  She has numbness or weakness of her right leg.  In 2019 patient had onset of numbness around her right rib cage area, near her bra line on the right side, which has been persistent.  December 2019 patient had onset of nonspecific pressure headaches in the front and back of her head, pressure behind her eyes.  No nausea or vomiting.  No sensitivity light or sound.  No prior similar headaches.  January 2020 patient had onset of intermittent blurred vision, intermittent dizziness and balance difficulty, leaning to the left side.  As a result of the symptoms patient went to PCP and had MRI of the brain ordered.  This showed multiple round and ovoid periventricular, subcortical, T2 hyperintensities.  Facilitated  diffusion and T1 black holes were noted.  Therefore possibility of underlying demyelinating disease or multiple sclerosis was raised.  No abnormal enhancing lesions were noted.    Observations/Objective:  - awake, alert, face symm - no dysarthria - lang fluent, comprehension intact   05/01/18 MRI brain - Periventricular greater than subcortical white matter lesions, without restricted diffusion or postcontrast enhancement, concerning for chronic multiple sclerosis. - No acute intracranial abnormality. No abnormal postcontrast enhancement. - No finding is identified which might contribute to headache.  05/29/18 MRI cervical spine - Signal abnormality within the right hemicord at C5 without postcontrast enhancement is most worrisome for demyelinating disease such as multiple sclerosis. - Cervical spondylosis as described above without central canal narrowing. Uncovertebral disease causing moderate to moderately severe foraminal narrowing is seen at C5-6 and C6-7.  05/29/18 MRI thoracic spine A large central and right paracentral disc extrusion at T7-8 contacts and deforms the cord. Edema within the cord subjacent to the disc extends within the right cord to approximately T8-9 level and could be secondary to cord compression but may also reflect demyelinating disease such as multiple sclerosis.  05/14/18 MRI lumbar spine 1. Diffuse lumbar spine spondylosis as described above. 2. At L4-5 there is a broad-based disc bulge with a broad central disc protrusion. Severe bilateral facet arthropathy. Severe spinal stenosis. Mild right foraminal stenosis. 3.  No acute osseous injury of the lumbar spine.   Assessment and Plan:  Dx:  1. MS (multiple sclerosis) ([place])   2. Thoracic disc herniation   3. Spinal stenosis of lumbar region with neurogenic  claudication      multiple sclerosis (RRMS) - start copaxone for now (safe; has slightly elevated LFTs; diabetes; JCV ab positive;  planning to have surgery for lumbar spinal stenosis soon; we may consider ocrevus in future)  T7-8disc herniation --> thoracic spinal cord compression  - per Dr. Lynann Bologna (ortho)  L4-5 Severe Lumbar spinal stenosis  - per Dr. Lynann Bologna (ortho)   Follow Up Instructions:  - Return in about 4 months (around 11/18/2018).    I discussed the assessment and treatment plan with the patient. The patient was provided an opportunity to ask questions and all were answered. The patient agreed with the plan and demonstrated an understanding of the instructions.   The patient was advised to call back or seek an in-person evaluation if the symptoms worsen or if the condition fails to improve as anticipated.  I provided 25 minutes of non-face-to-face time during this encounter.   Penni Bombard, MD 0/86/5784, 6:96 PM Certified in Neurology, Neurophysiology and Neuroimaging  [facility] [address], [place], [place] [address] [phone]

## 2018-07-20 ENCOUNTER — Ambulatory Visit: Payer: BLUE CROSS/BLUE SHIELD | Admitting: Sports Medicine

## 2018-07-20 NOTE — Telephone Encounter (Signed)
Copaxone Rx and Service request form faxed to [employer] with all supporting documents, labs, scans, insurance, demographics. Copy without providers signature mailed to patient with [employer] web site for further information .

## 2018-07-20 NOTE — Progress Notes (Signed)
This encounter was created in error - please disregard.

## 2018-07-24 ENCOUNTER — Telehealth: Payer: Self-pay | Admitting: *Deleted

## 2018-07-24 NOTE — Telephone Encounter (Signed)
Received fax from [facility] re: PA for Copaxone and PA for Glatiramer Acetate on CMM. Called [facility] and was told they don't do PA on these types of medications. Representative gave different member id: [id number] for patient, advised I call [employer], [phone], fax [phone] to do PA. I called [employer], spoke with Shirlean Mylar who went through PA process on [email or web address] with me.  She advised I will receive reply within 3 days by fax because we marked request as urgent. I faxed supporting documents to [employer] with EOD ID#: [id number]. Received confirmation of fax.

## 2018-07-25 NOTE — Telephone Encounter (Addendum)
Received fax from [employer]: Copaxone 49 mg/mL syringe is approved from 07/24/2018 to 07/23/2019. Approval letter was successfully faxed to [employer]. Member ID [id number], [place] # [id number]. Concerns, contact [phone].

## 2018-07-26 NOTE — Telephone Encounter (Signed)
Called patient and informed her that her insurance approved Copaxone for a year. Advised she should get a call from [employer] to schedule a delivery of the medication. She stated she has received the injection device,  verbalized understanding, appreciation.

## 2018-07-27 ENCOUNTER — Ambulatory Visit (INDEPENDENT_AMBULATORY_CARE_PROVIDER_SITE_OTHER): Payer: Self-pay | Admitting: Sports Medicine

## 2018-07-27 ENCOUNTER — Other Ambulatory Visit: Payer: Self-pay

## 2018-07-27 ENCOUNTER — Encounter: Payer: Self-pay | Admitting: Sports Medicine

## 2018-07-27 ENCOUNTER — Telehealth: Payer: Self-pay | Admitting: Diagnostic Neuroimaging

## 2018-07-27 DIAGNOSIS — M170 Bilateral primary osteoarthritis of knee: Secondary | ICD-10-CM

## 2018-07-27 NOTE — Progress Notes (Signed)
Subjective:    CC: Follow-up  HPI: This is a pleasant 48 year old female with bilateral knee osteoarthritis, we injected her right knee at the last visit, this is doing well, her left knee was last injected in October 2019, now having recurrence of pain, moderate, persistent joint lines without radiation.  Desires repeat interventional treatment today.  I reviewed the past medical history, family history, social history, surgical history, and allergies today and no changes were needed.  Please see the problem list section below in epic for further details.  Past Medical History: Past Medical History:  Diagnosis Date  . Abnormal Pap smear of cervix   . Anemia   . Dysmenorrhea   . Hemorrhoids   . Hypertension   . Menorrhagia   . Pelvic adhesions   . Peripheral neuropathy 01/21/2016  . Spinal stenosis at L4-L5 level 03/10/2016  . Tubo-ovarian abscess 10/2015  . White matter abnormality on MRI of brain 05/01/2018   Past Surgical History: Past Surgical History:  Procedure Laterality Date  . APPENDECTOMY    . ENDOMETRIAL ABLATION  08/16/2015  . MANDIBLE SURGERY    . OOPHORECTOMY Right 10/2015   for ovarian abscess   Social History: Social History   Socioeconomic History  . Marital status: Significant Other    Spouse name: Not on file  . Number of children: 5  . Years of education: Not on file  . Highest education level: Some college, no degree  Occupational History    Comment: [employer], [employer]  Social Needs  . Financial resource strain: Not on file  . Food insecurity    Worry: Not on file    Inability: Not on file  . Transportation needs    Medical: Not on file    Non-medical: Not on file  Tobacco Use  . Smoking status: Former Smoker    Quit date: 03/24/2015    Years since quitting: 3.3  . Smokeless tobacco: Never Used  Substance and Sexual Activity  . Alcohol use: Yes    Comment: social  . Drug use: No  . Sexual activity: Yes    Birth  control/protection: Condom  Lifestyle  . Physical activity    Days per week: Not on file    Minutes per session: Not on file  . Stress: Not on file  Relationships  . Social Herbalist on phone: Not on file    Gets together: Not on file    Attends religious service: Not on file    Active member of club or organization: Not on file    Attends meetings of clubs or organizations: Not on file    Relationship status: Not on file  Other Topics Concern  . Not on file  Social History Narrative   Recently relocated from [place] with her fiance   Caffeine- none   Family History: Family History  Problem Relation Age of Onset  . Diabetes Mother   . Stroke Mother   . Diabetes Father   . Hyperlipidemia Father   . Hypertension Father   . Heart attack Father   . Diabetes Brother   . Hypertension Son   . Heart attack Paternal Grandmother   . Heart attack Paternal Grandfather   . Brain cancer Brother   . Brain cancer Nephew   . Diabetes type I Niece   . Rickets Sister    Allergies: Allergies  Allergen Reactions  . Other Anaphylaxis    Sunflower oils   Medications: See med rec.  Review of Systems: No fevers, chills, night sweats, weight loss, chest pain, or shortness of breath.   Objective:    General: Well Developed, well nourished, and in no acute distress.  Neuro: Alert and oriented x3, extra-ocular muscles intact, sensation grossly intact.  HEENT: Normocephalic, atraumatic, pupils equal round reactive to light, neck supple, no masses, no lymphadenopathy, thyroid nonpalpable.  Skin: Warm and dry, no rashes. Cardiac: Regular rate and rhythm, no murmurs rubs or gallops, no lower extremity edema.  Respiratory: Clear to auscultation bilaterally. Not using accessory muscles, speaking in full sentences. Left knee: Minimally swollen Palpation normal with no warmth or joint line tenderness or patellar tenderness or condyle tenderness. ROM normal in flexion and extension  and lower leg rotation. Ligaments with solid consistent endpoints including ACL, PCL, LCL, MCL. Negative Mcmurray's and provocative meniscal tests. Non painful patellar compression. Patellar and quadriceps tendons unremarkable. Hamstring and quadriceps strength is normal.  Procedure: Real-time Ultrasound Guided injection of the left knee  device: GE Logiq E  Verbal informed consent obtained.  Time-out conducted.  Noted no overlying erythema, induration, or other signs of local infection.  Skin prepped in a sterile fashion.  Local anesthesia: Topical Ethyl chloride.  With sterile technique and under real time ultrasound guidance:  1 cc Kenalog 40, 2 cc lidocaine, 2 cc bupivacaine injected easily Completed without difficulty  Pain immediately resolved suggesting accurate placement of the medication.  Advised to call if fevers/chills, erythema, induration, drainage, or persistent bleeding.  Images permanently stored and available for review in the ultrasound unit.  Impression: Technically successful ultrasound guided injection.  Impression and Recommendations:    Primary osteoarthritis of both knees Right knee is doing well after injection a month ago. Recurrence of pain in the left, injected today, return as needed.  I spent 25 minutes with this patient, greater than 50% was face-to-face time counseling regarding the above diagnoses.  ___________________________________________ Gwen Her. Dianah Field, M.D., ABFM., CAQSM. Primary Care and Sports Medicine [facility]  Adjunct Professor of [place] [employer] [facility] of Medicine

## 2018-07-27 NOTE — Telephone Encounter (Signed)
Called [facility], [place] spoke with [facility] and advised her the patient had told me she already received injector in the mail. Misty stated it is for copaxone, and she'll be getting glatiramer acetate. I advised Misty her insurance approved copaxone. Misty stated the Rx was processed for copaxone due to the way the provider prescribed, but they got approval for generic with co pay assistance. She asked for Rx for whisperjet; I advised her I was going to give a verbal. She got pharmacist, Merrilee Seashore on the line. Merrilee Seashore took verbal Rx for whisperjet injector, stated there is no charge to the patient, and it will ship with glatiramer acetate, made by [place]. He  verbalized understanding, appreciation.

## 2018-07-27 NOTE — Telephone Encounter (Signed)
Matt from [facility] called needing a verbal order for them to send the pt the WhisperJect auto injector. Please advise.

## 2018-07-27 NOTE — Assessment & Plan Note (Signed)
Right knee is doing well after injection a month ago. Recurrence of pain in the left, injected today, return as needed.

## 2018-07-31 ENCOUNTER — Encounter: Payer: Self-pay | Admitting: *Deleted

## 2018-07-31 NOTE — Telephone Encounter (Addendum)
Received call [name] stating they have been unable to reach patient to schedule shipment of glatiramer acetate. She needs to call  [phone]. He stated she has $0 co pay. I advised him I will call her. He verbalized understanding, appreciation. Called patient and LVM advising her of above. I advised her I am sending my chart message as well.

## 2018-08-01 ENCOUNTER — Ambulatory Visit: Payer: Medicaid Other | Admitting: Physician Assistant

## 2018-08-15 ENCOUNTER — Ambulatory Visit (INDEPENDENT_AMBULATORY_CARE_PROVIDER_SITE_OTHER): Payer: Medicaid Other | Admitting: Physician Assistant

## 2018-08-15 ENCOUNTER — Encounter: Payer: Self-pay | Admitting: Physician Assistant

## 2018-08-15 ENCOUNTER — Telehealth: Payer: Self-pay | Admitting: *Deleted

## 2018-08-15 ENCOUNTER — Other Ambulatory Visit: Payer: Self-pay

## 2018-08-15 VITALS — BP 154/88 | Wt 232.0 lb

## 2018-08-15 DIAGNOSIS — E1165 Type 2 diabetes mellitus with hyperglycemia: Secondary | ICD-10-CM

## 2018-08-15 DIAGNOSIS — E1159 Type 2 diabetes mellitus with other circulatory complications: Secondary | ICD-10-CM

## 2018-08-15 DIAGNOSIS — F329 Major depressive disorder, single episode, unspecified: Secondary | ICD-10-CM | POA: Insufficient documentation

## 2018-08-15 DIAGNOSIS — F418 Other specified anxiety disorders: Secondary | ICD-10-CM | POA: Insufficient documentation

## 2018-08-15 DIAGNOSIS — I152 Hypertension secondary to endocrine disorders: Secondary | ICD-10-CM

## 2018-08-15 DIAGNOSIS — I10 Essential (primary) hypertension: Secondary | ICD-10-CM

## 2018-08-15 DIAGNOSIS — Z7189 Other specified counseling: Secondary | ICD-10-CM | POA: Insufficient documentation

## 2018-08-15 DIAGNOSIS — G35 Multiple sclerosis: Secondary | ICD-10-CM

## 2018-08-15 LAB — POCT GLYCOSYLATED HEMOGLOBIN (HGB A1C): HbA1c, POC (controlled diabetic range): 8.5 % — AB (ref 0.0–7.0)

## 2018-08-15 MED ORDER — GLATIRAMER ACETATE 20 MG/ML ~~LOC~~ SOSY: 40.0000 mg | PREFILLED_SYRINGE | Freq: Once | SUBCUTANEOUS | Status: DC

## 2018-08-15 MED ORDER — GLATIRAMER ACETATE 40 MG/ML ~~LOC~~ SOSY
40.0000 mg | PREFILLED_SYRINGE | Freq: Once | SUBCUTANEOUS | Status: AC
  Administered 2018-08-15: 40 mg via SUBCUTANEOUS

## 2018-08-15 MED ORDER — LISINOPRIL-HYDROCHLOROTHIAZIDE 20-25 MG PO TABS: 1.0000 | ORAL_TABLET | Freq: Every day | ORAL | 0 refills | Status: DC

## 2018-08-15 MED ORDER — GLATIRAMER ACETATE 40 MG/ML ~~LOC~~ SOSY: 40.0000 mg | PREFILLED_SYRINGE | Freq: Once | SUBCUTANEOUS | Status: DC

## 2018-08-15 MED ORDER — JANUMET 50-1000 MG PO TABS: 1.0000 | ORAL_TABLET | Freq: Two times a day (BID) | ORAL | 0 refills | Status: DC

## 2018-08-15 NOTE — Progress Notes (Signed)
HPI:                                                                Jeanette [name] is a 48 y.o. female who presents to [facility] today for medication management  Patient is newly diagnosed with MS and has been prescribed Copaxone by her neurologist. Presents today with medication and autoinjector to receive first injection and injection education. Reports partner and daughter are also open to learning injection. She tells me she feels very overwhelmed and depressed by this diagnosis and does not fully understand what it means. She does not feel Neurologist has been helpful and would like a new referral.  HTN: has not been taking her medication for several weeks. Reports she ran out and has been overwhelmed with everything. Does not checks BP's at home. Denies vision change, headache, chest pain with exertion, orthopnea, lightheadedness, syncope and edema. Risk factors include: DM2, obesity  DMII: new diagnosis in January 2020. Taking Metformin 500 mg twice a day. Compliant with medications.  Denies polydipsia, polyuria, polyphagia. Denies blurred vision or vision change.  Denies ulcers/wounds on feet.She has chronic peripheral neuropathy from lumbar spinal stenosis.. Glucometer: OTC Blood glucose readings: none  She states she filed for disability 6 months ago and everything is complete apart from needing follow-up with behavioral health   Past Medical History:  Diagnosis Date  . Abnormal Pap smear of cervix   . Anemia   . Dysmenorrhea   . Hemorrhoids   . Hypertension   . Menorrhagia   . Pelvic adhesions   . Peripheral neuropathy 01/21/2016  . Spinal stenosis at L4-L5 level 03/10/2016  . Tubo-ovarian abscess 10/2015  . White matter abnormality on MRI of brain 05/01/2018   Past Surgical History:  Procedure Laterality Date  . APPENDECTOMY    . ENDOMETRIAL ABLATION  08/16/2015  . MANDIBLE SURGERY    . OOPHORECTOMY Right 10/2015   for  ovarian abscess   Social History   Tobacco Use  . Smoking status: Former Smoker    Quit date: 03/24/2015    Years since quitting: 3.3  . Smokeless tobacco: Never Used  Substance Use Topics  . Alcohol use: Yes    Comment: social   family history includes Brain cancer in her brother and nephew; Diabetes in her brother, father, and mother; Diabetes type I in her niece; Heart attack in her father, paternal grandfather, and paternal grandmother; Hyperlipidemia in her father; Hypertension in her father and son; Rickets in her sister; Stroke in her mother.    Review of Systems  Eyes: Negative for visual disturbance.       + blurred vision  Musculoskeletal: Positive for back pain.  Neurological: Positive for dizziness, weakness (right lower extremity), numbness and headaches.       + paresthesias  All other systems reviewed and are negative.  Medications: Current Outpatient Medications  Medication Sig Dispense Refill  . AMBULATORY NON FORMULARY MEDICATION Single glucometer with lancets, and test strips. Check morning fasting glucose daily and up to four times daily prn 1 each 0  . cyclobenzaprine (FLEXERIL) 5 MG tablet Take 5-10 mg by mouth at bedtime as needed.    Marland Kitchen emtricitabine-tenofovir (TRUVADA) 200-300 MG tablet Take 1 tablet by mouth  daily. 30 tablet 2  . gabapentin (NEURONTIN) 300 MG capsule Take 1-2 capsules (300-600 mg total) by mouth at bedtime. 60 capsule 3  . Glatiramer Acetate (COPAXONE) 40 MG/ML SOSY Inject 40 mg into the skin 3 (three) times a week. Rx Benefits: approved from 07/24/2018 to 07/23/2019, member ID [id number], [place] # [id number]    . HYDROcodone-acetaminophen (NORCO/VICODIN) 5-325 MG tablet TAKE 1 TABLET BY MOUTH AT BEDTIME AS NEEDED FOR PAIN    . lisinopril-hydrochlorothiazide (ZESTORETIC) 20-25 MG tablet Take 1 tablet by mouth daily. 90 tablet 0  . omeprazole (PRILOSEC) 20 MG capsule Take 1 capsule (20 mg total) by mouth daily before breakfast. 90 capsule 1  .  traMADol (ULTRAM) 50 MG tablet Take 1 tablet (50 mg total) by mouth every 6 (six) hours as needed for moderate pain or severe pain. Maximum 6 tabs per day. 30 tablet 2  . sitaGLIPtin-metformin (JANUMET) 50-1000 MG tablet Take 1 tablet by mouth 2 (two) times daily with a meal. 180 tablet 0   No current facility-administered medications for this visit.    Allergies  Allergen Reactions  . Other Anaphylaxis    Sunflower oils       Objective:  BP (!) 154/88   Wt 232 lb (105.2 kg)   BMI 38.61 kg/m  Vitals:   08/15/18 1106 08/15/18 1248  BP: (!) 148/100 (!) 154/88   Gen:  alert, not ill-appearing, no distress, appropriate for age 30: head normocephalic without obvious abnormality, conjunctiva and cornea clear, trachea midline Pulm: Normal work of breathing, normal phonation, clear to auscultation bilaterally, no wheezes, rales or rhonchi CV: Normal rate, regular rhythm, s1 and s2 distinct, no murmurs, clicks or rubs  Neuro: alert and oriented x 3 MSK: extremities atraumatic, no peripheral edema Skin: intact, no rashes on exposed skin, no jaundice, no cyanosis Neuro: alert and oriented x 3 Psych: cooperative, depressed mood, affect mood-congruent, she is tearful, speech is articulate, normal rate and volume; thought processes clear and goal-directed, normal judgment, good insight    Results for orders placed or performed in visit on 08/15/18 (from the past 72 hour(s))  POCT HgB A1C     Status: Abnormal   Collection Time: 08/15/18 11:08 AM  Result Value Ref Range   Hemoglobin A1C     HbA1c POC (<> result, manual entry)     HbA1c, POC (prediabetic range)     HbA1c, POC (controlled diabetic range) 8.5 (A) 0.0 - 7.0 %   No results found.    Assessment and Plan: 48 y.o. female with   .Diagnoses and all orders for this visit:  Uncontrolled type 2 diabetes mellitus with hyperglycemia ([facility]) -     POCT HgB A1C -     sitaGLIPtin-metformin (JANUMET) 50-1000 MG tablet; Take 1  tablet by mouth 2 (two) times daily with a meal.  Hypertension associated with diabetes ([facility]) -     lisinopril-hydrochlorothiazide (ZESTORETIC) 20-25 MG tablet; Take 1 tablet by mouth daily.  MS (multiple sclerosis) ([place]) -     Discontinue: glatiramer (COPAXONE) injection 40 mg -     Ambulatory referral to Psychology -     Ambulatory referral to Neurology  Encounter for injection education  Anxiety about health -     Ambulatory referral to Psychology  Reactive depression -     Ambulatory referral to Psychology  Uncontrolled Type 2 DM Lab Results  Component Value Date   HGBA1C 8.5 (A) 08/15/2018  A1C increased >1.0 point  Switching to Janumet Foot exam UTD  Reminded to schedule dilated eye exam with ophthalmology. Referral placed 4 months ago Blood pressure goal less than 130/80, re-start ACE LDL goal <70, she did not complete fasting lipids ordered in December, will defer statin today since patient is starting a new medication which may cause varied adverse effects Follow-up in 3 months   MS First injection of Copaxone administered by me SQ in left arm at 11:40 am  Patient will return with partner on Friday for injection education / 2nd dose. Plans to administer 3 days per week in office M, W, F until comfortable with self-injection at home Referral placed to [facility] for counseling / MS resources New referral to Dr. Felecia Shelling (Neuro)   Patient education and anticipatory guidance given Patient agrees with treatment plan Follow-up as above as needed if symptoms worsen or fail to improve  I spent 25 minutes with this patient, greater than 50% was face-to-face time counseling regarding the above diagnoses  Darlyne Russian PA-C

## 2018-08-15 NOTE — Addendum Note (Signed)
Addended by: Nelson Chimes E on: 08/15/2018 02:43 PM   Modules accepted: Orders

## 2018-08-15 NOTE — Patient Instructions (Addendum)
Diabetes Preventive Care: - annual foot exam  - annual dilated eye exam with an eye doctor - self foot exams at least weekly - pneumonia vaccine once (booster in 5 years and at age 50) - annual influenza vaccine - twice yearly dental cleanings and yearly exam - goal blood pressure <140/90, ideally <130/80 - LDL cholesterol <70 - A1C <7.0 - body mass index (BMI) <25.0 - follow-up every 3 months if your A1C is not at goal - follow-up every 6 months if diabetes is well controlled   Carbohydrate Counting for Diabetes Mellitus, Adult  Carbohydrate counting is a method of keeping track of how many carbohydrates you eat. Eating carbohydrates naturally increases the amount of sugar (glucose) in the blood. Counting how many carbohydrates you eat helps keep your blood glucose within normal limits, which helps you manage your diabetes (diabetes mellitus). It is important to know how many carbohydrates you can safely have in each meal. This is different for every person. A diet and nutrition specialist (registered dietitian) can help you make a meal plan and calculate how many carbohydrates you should have at each meal and snack. Carbohydrates are found in the following foods:  Grains, such as breads and cereals.  Dried beans and soy products.  Starchy vegetables, such as potatoes, peas, and corn.  Fruit and fruit juices.  Milk and yogurt.  Sweets and snack foods, such as cake, cookies, candy, chips, and soft drinks. How do I count carbohydrates? There are two ways to count carbohydrates in food. You can use either of the methods or a combination of both. Reading "Nutrition Facts" on packaged food The "Nutrition Facts" list is included on the labels of almost all packaged foods and beverages in the [place] It includes:  The serving size.  Information about nutrients in each serving, including the grams (g) of carbohydrate per serving. To use the "Nutrition Facts":  Decide how many servings  you will have.  Multiply the number of servings by the number of carbohydrates per serving.  The resulting number is the total amount of carbohydrates that you will be having. Learning standard serving sizes of other foods When you eat carbohydrate foods that are not packaged or do not include "Nutrition Facts" on the label, you need to measure the servings in order to count the amount of carbohydrates:  Measure the foods that you will eat with a food scale or measuring cup, if needed.  Decide how many standard-size servings you will eat.  Multiply the number of servings by 15. Most carbohydrate-rich foods have about 15 g of carbohydrates per serving. ? For example, if you eat 8 oz (170 g) of strawberries, you will have eaten 2 servings and 30 g of carbohydrates (2 servings x 15 g = 30 g).  For foods that have more than one food mixed, such as soups and casseroles, you must count the carbohydrates in each food that is included. The following list contains standard serving sizes of common carbohydrate-rich foods. Each of these servings has about 15 g of carbohydrates:   hamburger bun or  English muffin.   oz (15 mL) syrup.   oz (14 g) jelly.  1 slice of bread.  1 six-inch tortilla.  3 oz (85 g) cooked rice or pasta.  4 oz (113 g) cooked dried beans.  4 oz (113 g) starchy vegetable, such as peas, corn, or potatoes.  4 oz (113 g) hot cereal.  4 oz (113 g) mashed potatoes or  of a  large baked potato.  4 oz (113 g) canned or frozen fruit.  4 oz (120 mL) fruit juice.  4-6 crackers.  6 chicken nuggets.  6 oz (170 g) unsweetened dry cereal.  6 oz (170 g) plain fat-free yogurt or yogurt sweetened with artificial sweeteners.  8 oz (240 mL) milk.  8 oz (170 g) fresh fruit or one small piece of fruit.  24 oz (680 g) popped popcorn. Example of carbohydrate counting Sample meal  3 oz (85 g) chicken breast.  6 oz (170 g) brown rice.  4 oz (113 g) corn.  8 oz (240  mL) milk.  8 oz (170 g) strawberries with sugar-free whipped topping. Carbohydrate calculation 1. Identify the foods that contain carbohydrates: ? Rice. ? Corn. ? Milk. ? Strawberries. 2. Calculate how many servings you have of each food: ? 2 servings rice. ? 1 serving corn. ? 1 serving milk. ? 1 serving strawberries. 3. Multiply each number of servings by 15 g: ? 2 servings rice x 15 g = 30 g. ? 1 serving corn x 15 g = 15 g. ? 1 serving milk x 15 g = 15 g. ? 1 serving strawberries x 15 g = 15 g. 4. Add together all of the amounts to find the total grams of carbohydrates eaten: ? 30 g + 15 g + 15 g + 15 g = 75 g of carbohydrates total. Summary  Carbohydrate counting is a method of keeping track of how many carbohydrates you eat.  Eating carbohydrates naturally increases the amount of sugar (glucose) in the blood.  Counting how many carbohydrates you eat helps keep your blood glucose within normal limits, which helps you manage your diabetes.  A diet and nutrition specialist (registered dietitian) can help you make a meal plan and calculate how many carbohydrates you should have at each meal and snack. This information is not intended to replace advice given to you by your health care provider. Make sure you discuss any questions you have with your health care provider. Document Released: 01/25/2005 Document Revised: 08/19/2016 Document Reviewed: 07/09/2015 Elsevier Patient Education  2020 [employer].

## 2018-08-15 NOTE — Telephone Encounter (Signed)
Received call form [facility] stating the patient was there to learn how to inject Mylan glatiramer Acetate (Copaxone). Charlena Cross stated she doesn't have instructions on injection itself, just how to prepare the whisper ject injector. I looked at [employer] site and advised she places whisperject at 90 degree angle to skin, presses lightly then injects. Charlena Cross stated she would instruct patient . I advised I will send Ottie a copy of Mylan instructions. Charlena Cross stated she would let patient know,  verbalized understanding, appreciation. Mylan whisper ject instructions printed off, mailed to patient today.

## 2018-08-17 ENCOUNTER — Encounter: Payer: Self-pay | Admitting: Physician Assistant

## 2018-08-17 ENCOUNTER — Ambulatory Visit: Payer: Medicaid Other | Admitting: Physician Assistant

## 2018-08-18 ENCOUNTER — Ambulatory Visit (INDEPENDENT_AMBULATORY_CARE_PROVIDER_SITE_OTHER): Payer: Self-pay | Admitting: Physician Assistant

## 2018-08-18 ENCOUNTER — Other Ambulatory Visit: Payer: Self-pay

## 2018-08-18 ENCOUNTER — Encounter: Payer: Self-pay | Admitting: Physician Assistant

## 2018-08-18 ENCOUNTER — Ambulatory Visit: Payer: Medicaid Other | Admitting: Physician Assistant

## 2018-08-18 VITALS — BP 120/76 | HR 83 | Temp 97.6°F

## 2018-08-18 DIAGNOSIS — Z7189 Other specified counseling: Secondary | ICD-10-CM

## 2018-08-18 DIAGNOSIS — G35 Multiple sclerosis: Secondary | ICD-10-CM

## 2018-08-18 MED ORDER — GLATIRAMER ACETATE 40 MG/ML ~~LOC~~ SOSY
40.0000 mg | PREFILLED_SYRINGE | Freq: Once | SUBCUTANEOUS | Status: AC
  Administered 2018-08-18: 40 mg via SUBCUTANEOUS

## 2018-08-18 NOTE — Progress Notes (Signed)
HPI:                                                                Jeanette [name] is a 48 y.o. female who presents to [facility] today for injection  Patient accompanied by partner, Jeanette [name], who will be learning SQ injection administration.  Here for 2nd dose of Copaxone  Past Medical History:  Diagnosis Date  . Abnormal Pap smear of cervix   . Anemia   . Dysmenorrhea   . Hemorrhoids   . Hypertension   . Menorrhagia   . Pelvic adhesions   . Peripheral neuropathy 01/21/2016  . Spinal stenosis at L4-L5 level 03/10/2016  . Tubo-ovarian abscess 10/2015  . White matter abnormality on MRI of brain 05/01/2018   Past Surgical History:  Procedure Laterality Date  . APPENDECTOMY    . ENDOMETRIAL ABLATION  08/16/2015  . MANDIBLE SURGERY    . OOPHORECTOMY Right 10/2015   for ovarian abscess   Social History   Tobacco Use  . Smoking status: Former Smoker    Quit date: 03/24/2015    Years since quitting: 3.4  . Smokeless tobacco: Never Used  Substance Use Topics  . Alcohol use: Yes    Comment: social   family history includes Brain cancer in her brother and nephew; Diabetes in her brother, father, and mother; Diabetes type I in her niece; Heart attack in her father, paternal grandfather, and paternal grandmother; Hyperlipidemia in her father; Hypertension in her father and son; Rickets in her sister; Stroke in her mother.    ROS: negative except as noted in the HPI  Medications: Current Outpatient Medications  Medication Sig Dispense Refill  . AMBULATORY NON FORMULARY MEDICATION Single glucometer with lancets, and test strips. Check morning fasting glucose daily and up to four times daily prn 1 each 0  . cyclobenzaprine (FLEXERIL) 5 MG tablet Take 5-10 mg by mouth at bedtime as needed.    Marland Kitchen emtricitabine-tenofovir (TRUVADA) 200-300 MG tablet Take 1 tablet by mouth daily. 30 tablet 2  . gabapentin (NEURONTIN) 300 MG capsule Take 1-2  capsules (300-600 mg total) by mouth at bedtime. 60 capsule 3  . Glatiramer Acetate (COPAXONE) 40 MG/ML SOSY Inject 40 mg into the skin 3 (three) times a week. Rx Benefits: approved from 07/24/2018 to 07/23/2019, member ID [id number], [place] # [id number]    . HYDROcodone-acetaminophen (NORCO/VICODIN) 5-325 MG tablet TAKE 1 TABLET BY MOUTH AT BEDTIME AS NEEDED FOR PAIN    . lisinopril-hydrochlorothiazide (ZESTORETIC) 20-25 MG tablet Take 1 tablet by mouth daily. 90 tablet 0  . omeprazole (PRILOSEC) 20 MG capsule Take 1 capsule (20 mg total) by mouth daily before breakfast. 90 capsule 1  . sitaGLIPtin-metformin (JANUMET) 50-1000 MG tablet Take 1 tablet by mouth 2 (two) times daily with a meal. 180 tablet 0  . traMADol (ULTRAM) 50 MG tablet Take 1 tablet (50 mg total) by mouth every 6 (six) hours as needed for moderate pain or severe pain. Maximum 6 tabs per day. 30 tablet 2   No current facility-administered medications for this visit.    Allergies  Allergen Reactions  . Other Anaphylaxis    Sunflower oils       Objective:  BP 120/76   Pulse 83  Temp 97.6 F (36.4 C) (Oral)     Results for orders placed or performed in visit on 08/15/18 (from the past 72 hour(s))  POCT HgB A1C     Status: Abnormal   Collection Time: 08/15/18 11:08 AM  Result Value Ref Range   Hemoglobin A1C     HbA1c POC (<> result, manual entry)     HbA1c, POC (prediabetic range)     HbA1c, POC (controlled diabetic range) 8.5 (A) 0.0 - 7.0 %   No results found.    Assessment and Plan: 48 y.o. female with   .Sonda was seen today for injection teaching.  Diagnoses and all orders for this visit:  Injection education, encounter for  MS (multiple sclerosis) ([place]) -     Glatiramer Acetate SOSY 40 mg   SQ injection given in left arm Patient tolerated injection well without any immediate complications Return Monday for next injection Partner will perform injection on Monday with my  supervision/assistance  Patient education and anticipatory guidance given Patient agrees with treatment plan Follow-up as needed if symptoms worsen or fail to improve  Darlyne Russian PA-C

## 2018-08-18 NOTE — Patient Instructions (Signed)
Subcutaneous Injection Instructions Using a Prefilled Syringe A subcutaneous injection is a shot of medicine that is given into the layer of fat and tissue between skin and muscle. The injection is given with a single-use syringe that is already filled with medicine (prefilled syringe). Read the medicine guide or package insert that came with the syringe. Follow directions from the guide about how to prepare and give the injection. This is important because the directions may be different for each medicine. Use only the syringe, needle, and medicine that your health care provider prescribes. Use each prefilled syringe and needle only one time. Supplies needed:  Prefilled syringe with needle. Use the needle length and size (gauge) that your health care provider or pharmacist gives to you.  Alcohol wipes.  Bandage.  A container for syringe disposal. This may be a puncture-proof sharps container or a hard-sided plastic container that has a secure lid, such as an empty laundry detergent bottle. How to choose a site for injection Follow instructions from your health care provider about where to give an injection. Do not inject in the same spot each time. There are five main areas that can be used for injecting. These areas include:  Abdomen. Avoid the area that is within 2 inches (5 cm) of your navel (umbilicus).  Front of thigh.  Upper, outer side of thigh.  Upper, outer side of arm.  Upper, outer part of buttock. How to give an injection using a prefilled syringe  1. Wash your hands with soap and water. If soap and water are not available, use hand sanitizer. 2. Use an alcohol wipe to clean the site where you will be injecting the needle. Let the site air-dry. 3. Remove the plastic cover from the needle on the syringe. Do not let the needle touch anything. 4. Hold the syringe with the needle pointing up. Check the syringe for any remaining air bubbles. If there are air bubbles, flick the  syringe with your finger until the air bubbles rise to the top. Then, gently push on the plunger until you can see a drop of medicine appear at the tip of the needle. This will clear any remaining air bubbles from the syringe. 5. Hold the syringe in your writing hand like a pencil. 6. Use your other hand to pinch and hold about an inch (2.5 cm) of skin. Do not directly touch the cleaned part of the skin. 7. Insert the entire needle straight into the fold of skin. The needle should be at a 90-degree angle (perpendicular) to the skin. Push the needle all the way against the skin. The needle may need to be injected at a 45-degree angle in thin adults or children who have a small amount of body fat. 8. After the needle is completely inserted into the skin, release the skin that you are pinching. Continue to hold the syringe with your writing hand. 9. Use your thumb or index finger of your writing hand to push the plunger all the way into the syringe to inject the medicine. 10. Pull the needle straight out of the skin. 11. Press and hold the alcohol wipe over the injection site until bleeding stops. Do not rub the area. 12. Cover the injection site with a bandage, if needed. How to safely throw away the supplies If you are using a syringe that does not have a safety system for shielding the needle after injection:  Do not recap the needle. Place the syringe and needle in the disposal  container. If your syringe has a safety system for shielding the needle after injection:  Firmly push down on the plunger after you complete the injection. The protective sleeve will automatically cover the needle, and you will hear a click. The click means that the needle is safely covered. Follow the disposal regulations for the area where you live. Do not use any syringe or needle more than one time. Contact a health care provider if:  You have difficulty giving the injection.  You think that the injection was not  given correctly.  You have difficulty with any of the supplies.  The medicine causes side effects.  Rashes develop on the skin.  A fever develops.  The condition that is being treated gets worse. Get help right away if: Any of these symptoms develop after the injection is given:  Difficulty breathing.  Chest pain.  A rash over most or all of the body.  Swelling of the lips or tongue.  Difficulty swallowing. Summary  A subcutaneous injection is a shot of medicine that is given into the layer of fat and tissue between skin and muscle.  Read the medicine guide or package insert that came with the syringe. Follow directions from the guide about how to prepare and give the injection.  Follow instructions from your health care provider about where to give an injection.  Contact a health care provider if you cannot give an injection, you think you gave it incorrectly, or you develop any side effects of the medicine.  Get help right away if any of these develop after an injection: difficulty breathing, chest pain, rash on the body, swelling of the lips or tongue, or difficulty swallowing. This information is not intended to replace advice given to you by your health care provider. Make sure you discuss any questions you have with your health care provider. Document Released: 10/08/2010 Document Revised: 05/18/2018 Document Reviewed: 10/26/2017 Elsevier Patient Education  2020 [employer].

## 2018-08-21 ENCOUNTER — Ambulatory Visit: Payer: Medicaid Other

## 2018-08-21 ENCOUNTER — Other Ambulatory Visit: Payer: Self-pay

## 2018-08-21 ENCOUNTER — Ambulatory Visit (INDEPENDENT_AMBULATORY_CARE_PROVIDER_SITE_OTHER): Payer: Medicaid Other | Admitting: Physician Assistant

## 2018-08-21 ENCOUNTER — Encounter: Payer: Self-pay | Admitting: Physician Assistant

## 2018-08-21 VITALS — BP 121/84 | HR 84 | Temp 98.2°F | Wt 232.0 lb

## 2018-08-21 DIAGNOSIS — G35 Multiple sclerosis: Secondary | ICD-10-CM

## 2018-08-21 MED ORDER — GLATIRAMER ACETATE 40 MG/ML ~~LOC~~ SOSY
40.0000 mg | PREFILLED_SYRINGE | Freq: Once | SUBCUTANEOUS | Status: AC
  Administered 2018-08-21: 40 mg via SUBCUTANEOUS

## 2018-08-21 NOTE — Progress Notes (Signed)
HPI:                                                                Jeanette [name] is a 48 y.o. female who presents to [facility] today for injection  Patient accompanied by partner, Annalee Genta, who will be learning SQ injection administration.  Here for 3rd dose of Copaxone  Past Medical History:  Diagnosis Date  . Abnormal Pap smear of cervix   . Anemia   . Dysmenorrhea   . Hemorrhoids   . Hypertension   . Menorrhagia   . Pelvic adhesions   . Peripheral neuropathy 01/21/2016  . Spinal stenosis at L4-L5 level 03/10/2016  . Tubo-ovarian abscess 10/2015  . White matter abnormality on MRI of brain 05/01/2018   Past Surgical History:  Procedure Laterality Date  . APPENDECTOMY    . ENDOMETRIAL ABLATION  08/16/2015  . MANDIBLE SURGERY    . OOPHORECTOMY Right 10/2015   for ovarian abscess   Social History   Tobacco Use  . Smoking status: Former Smoker    Quit date: 03/24/2015    Years since quitting: 3.4  . Smokeless tobacco: Never Used  Substance Use Topics  . Alcohol use: Yes    Comment: social   family history includes Brain cancer in her brother and nephew; Diabetes in her brother, father, and mother; Diabetes type I in her niece; Heart attack in her father, paternal grandfather, and paternal grandmother; Hyperlipidemia in her father; Hypertension in her father and son; Rickets in her sister; Stroke in her mother.    ROS: negative except as noted in the HPI  Medications: Current Outpatient Medications  Medication Sig Dispense Refill  . AMBULATORY NON FORMULARY MEDICATION Single glucometer with lancets, and test strips. Check morning fasting glucose daily and up to four times daily prn 1 each 0  . cyclobenzaprine (FLEXERIL) 5 MG tablet Take 5-10 mg by mouth at bedtime as needed.    Marland Kitchen emtricitabine-tenofovir (TRUVADA) 200-300 MG tablet Take 1 tablet by mouth daily. 30 tablet 2  . gabapentin (NEURONTIN) 300 MG capsule Take 1-2  capsules (300-600 mg total) by mouth at bedtime. 60 capsule 3  . Glatiramer Acetate (COPAXONE) 40 MG/ML SOSY Inject 40 mg into the skin 3 (three) times a week. Rx Benefits: approved from 07/24/2018 to 07/23/2019, member ID [id number], [place] # [id number]    . HYDROcodone-acetaminophen (NORCO/VICODIN) 5-325 MG tablet TAKE 1 TABLET BY MOUTH AT BEDTIME AS NEEDED FOR PAIN    . lisinopril-hydrochlorothiazide (ZESTORETIC) 20-25 MG tablet Take 1 tablet by mouth daily. 90 tablet 0  . omeprazole (PRILOSEC) 20 MG capsule Take 1 capsule (20 mg total) by mouth daily before breakfast. 90 capsule 1  . sitaGLIPtin-metformin (JANUMET) 50-1000 MG tablet Take 1 tablet by mouth 2 (two) times daily with a meal. 180 tablet 0  . traMADol (ULTRAM) 50 MG tablet Take 1 tablet (50 mg total) by mouth every 6 (six) hours as needed for moderate pain or severe pain. Maximum 6 tabs per day. 30 tablet 2   No current facility-administered medications for this visit.    Allergies  Allergen Reactions  . Other Anaphylaxis    Sunflower oils       Objective:  BP 121/84   Pulse 84  Temp 98.2 F (36.8 C) (Oral)   Wt 232 lb (105.2 kg)   BMI 38.61 kg/m     No results found for this or any previous visit (from the past 58 hour(s)). No results found.    Assessment and Plan: 48 y.o. female with   .Belma was seen today for injections.  Diagnoses and all orders for this visit:  MS (multiple sclerosis) ([place]) -     Glatiramer Acetate SOSY 40 mg   SQ injection given in right anterior thigh by caregiver with direct supervision Patient tolerated injection well without any immediate complications Caregiver will continue with administration at home   Follow-up as needed if symptoms worsen or fail to improve  Darlyne Russian PA-C

## 2018-08-23 ENCOUNTER — Ambulatory Visit: Payer: Medicaid Other | Admitting: Physician Assistant

## 2018-08-25 ENCOUNTER — Ambulatory Visit (INDEPENDENT_AMBULATORY_CARE_PROVIDER_SITE_OTHER): Payer: BC Managed Care – PPO | Admitting: Psychology

## 2018-08-25 ENCOUNTER — Ambulatory Visit: Payer: Medicaid Other | Admitting: Physician Assistant

## 2018-08-25 DIAGNOSIS — F064 Anxiety disorder due to known physiological condition: Secondary | ICD-10-CM

## 2018-08-31 ENCOUNTER — Telehealth: Payer: Self-pay | Admitting: Diagnostic Neuroimaging

## 2018-08-31 NOTE — Telephone Encounter (Signed)
This pt was just here in June and saw you. They are now being referred back and the doctor would like for them to see Dr Felecia Shelling. Is it ok to switch?

## 2018-08-31 NOTE — Telephone Encounter (Signed)
Ok with me 

## 2018-08-31 NOTE — Telephone Encounter (Signed)
This pt was just here in June and saw Dr Leta Baptist. They are being referred back and the doctor is requesting the pt see you. Is it ok to switch to you?

## 2018-09-04 NOTE — Telephone Encounter (Signed)
Ok to switch from my standpoint. -VRP

## 2018-09-05 DIAGNOSIS — Z0271 Encounter for disability determination: Secondary | ICD-10-CM

## 2018-09-06 ENCOUNTER — Other Ambulatory Visit: Payer: Self-pay

## 2018-09-06 DIAGNOSIS — M48061 Spinal stenosis, lumbar region without neurogenic claudication: Secondary | ICD-10-CM

## 2018-09-07 MED ORDER — TRAMADOL HCL 50 MG PO TABS: 50.0000 mg | ORAL_TABLET | Freq: Four times a day (QID) | ORAL | 2 refills | Status: DC | PRN

## 2018-09-12 ENCOUNTER — Ambulatory Visit: Payer: Self-pay | Admitting: Psychology

## 2018-09-12 ENCOUNTER — Ambulatory Visit: Payer: BC Managed Care – PPO | Admitting: Psychology

## 2018-09-19 ENCOUNTER — Ambulatory Visit: Payer: BC Managed Care – PPO | Admitting: Neurology

## 2018-09-20 ENCOUNTER — Encounter: Payer: Self-pay | Admitting: Neurology

## 2018-09-26 ENCOUNTER — Ambulatory Visit: Payer: BC Managed Care – PPO | Admitting: Psychology

## 2018-10-02 ENCOUNTER — Telehealth: Payer: Self-pay | Admitting: *Deleted

## 2018-10-02 NOTE — Telephone Encounter (Signed)
Received fax from [facility] re: unable to reach patient to ship Copaxone. Of note, patient was no show for new patient appt with Dr Felecia Shelling on 09/19/18- per referral from PCP. Called patient, left detailed VM informing her of fax I received and that I noted she had missed her appt with Dr Felecia Shelling. I asked her to call back and reschedule with Dr Felecia Shelling and let me know if she has gotten Copaxone. I gave her [facility] pharmacy # and our office number.

## 2018-10-12 ENCOUNTER — Ambulatory Visit: Payer: BC Managed Care – PPO | Admitting: Psychology

## 2018-10-23 ENCOUNTER — Telehealth: Payer: Self-pay | Admitting: Physician Assistant

## 2018-10-23 NOTE — Telephone Encounter (Signed)
[employer] Advancing Access Enrollment Form Please have patient complete section #3, #4,#8, #9

## 2018-10-24 ENCOUNTER — Ambulatory Visit: Payer: BC Managed Care – PPO | Admitting: Psychology

## 2018-10-24 ENCOUNTER — Ambulatory Visit: Payer: Medicaid Other | Admitting: Physician Assistant

## 2018-10-25 NOTE — Telephone Encounter (Signed)
I do not have this form in my office. Is this at your desk? Please advise.

## 2018-10-26 NOTE — Telephone Encounter (Signed)
I have the form Patient was a No Show to appt yesterday I will leave the form with you

## 2018-10-30 NOTE — Telephone Encounter (Signed)
Patient will stop by the office this week to sign the form.

## 2018-10-31 ENCOUNTER — Other Ambulatory Visit: Payer: Self-pay | Admitting: Physician Assistant

## 2018-10-31 DIAGNOSIS — M48061 Spinal stenosis, lumbar region without neurogenic claudication: Secondary | ICD-10-CM

## 2018-10-31 DIAGNOSIS — M5416 Radiculopathy, lumbar region: Secondary | ICD-10-CM

## 2018-11-01 ENCOUNTER — Ambulatory Visit: Payer: BC Managed Care – PPO | Admitting: Neurology

## 2018-11-06 ENCOUNTER — Encounter: Payer: Self-pay | Admitting: Neurology

## 2018-11-07 NOTE — Telephone Encounter (Signed)
Patient has not brought the form back. Closing encounter.

## 2018-11-15 ENCOUNTER — Other Ambulatory Visit: Payer: Self-pay

## 2018-11-15 ENCOUNTER — Ambulatory Visit (INDEPENDENT_AMBULATORY_CARE_PROVIDER_SITE_OTHER): Payer: Self-pay | Admitting: Osteopathic Medicine

## 2018-11-15 ENCOUNTER — Encounter: Payer: Self-pay | Admitting: Osteopathic Medicine

## 2018-11-15 VITALS — BP 142/93 | HR 73 | Temp 98.7°F | Wt 231.8 lb

## 2018-11-15 DIAGNOSIS — Z23 Encounter for immunization: Secondary | ICD-10-CM

## 2018-11-15 DIAGNOSIS — I152 Hypertension secondary to endocrine disorders: Secondary | ICD-10-CM

## 2018-11-15 DIAGNOSIS — M5416 Radiculopathy, lumbar region: Secondary | ICD-10-CM

## 2018-11-15 DIAGNOSIS — E1165 Type 2 diabetes mellitus with hyperglycemia: Secondary | ICD-10-CM

## 2018-11-15 DIAGNOSIS — G63 Polyneuropathy in diseases classified elsewhere: Secondary | ICD-10-CM

## 2018-11-15 DIAGNOSIS — I10 Essential (primary) hypertension: Secondary | ICD-10-CM

## 2018-11-15 DIAGNOSIS — G35 Multiple sclerosis: Secondary | ICD-10-CM

## 2018-11-15 DIAGNOSIS — Z1231 Encounter for screening mammogram for malignant neoplasm of breast: Secondary | ICD-10-CM

## 2018-11-15 DIAGNOSIS — E1159 Type 2 diabetes mellitus with other circulatory complications: Secondary | ICD-10-CM

## 2018-11-15 LAB — POCT GLYCOSYLATED HEMOGLOBIN (HGB A1C): Hemoglobin A1C: 9.2 % — AB (ref 4.0–5.6)

## 2018-11-15 MED ORDER — LISINOPRIL 40 MG PO TABS: 40.0000 mg | ORAL_TABLET | Freq: Every day | ORAL | 3 refills | Status: DC

## 2018-11-15 MED ORDER — CYCLOBENZAPRINE HCL 10 MG PO TABS: 5.0000 mg | ORAL_TABLET | Freq: Every evening | ORAL | 1 refills | Status: DC | PRN

## 2018-11-15 MED ORDER — GABAPENTIN 300 MG PO CAPS: ORAL_CAPSULE | ORAL | 0 refills | Status: DC

## 2018-11-15 MED ORDER — METFORMIN HCL 1000 MG PO TABS: 1000.0000 mg | ORAL_TABLET | Freq: Two times a day (BID) | ORAL | 1 refills | Status: DC

## 2018-11-15 MED ORDER — HYDROCHLOROTHIAZIDE 25 MG PO TABS: 25.0000 mg | ORAL_TABLET | Freq: Every day | ORAL | 3 refills | Status: DC

## 2018-11-15 NOTE — Patient Instructions (Addendum)
Blood pressure: To optimize control, let's increase to Lisinopril 40 mg, keep Hydrochlorothiazide at 25 mg. This is not available I na combination pill, but bot are inexpensive at [facility] so I sent separate Rx  MS: Will get paperwork done for the injections for MS  Diabetes: A1C today 9.2. Let's increase the Metformin! You are currently on 500 mg twice daily based on my review of the chart, please confirm this for [place]? Let's increase to 1000 mg twice daily and plan to recheck in 3 months. If A1C still not closer to goal (7 or less) we might need to add another medication. Watch diet! Exercise as able!   Anything else needed from your disability lawyers, please let me know!

## 2018-11-15 NOTE — Progress Notes (Signed)
HPI: Jeanette [name] is a 48 y.o. female who  has a past medical history of Abnormal Pap smear of cervix, Anemia, Dysmenorrhea, Hemorrhoids, Hypertension, Menorrhagia, Pelvic adhesions, Peripheral neuropathy (01/21/2016), Spinal stenosis at L4-L5 level (03/10/2016), Tubo-ovarian abscess (10/2015), and White matter abnormality on MRI of brain (05/01/2018).  she [facility] to [facility], 11/15/18,  for chief complaint of:  Establish care w/ new PCP  DM2 HTN MS  Type 2 diabetes: Currently taking metformin 500 mg twice daily.  Has been a while since last A1c but today is definitely above goal.  Patient reports could be doing better in terms of diet/exercise  Hypertension: Above goal, patient is compliant with medications as below.  No chest pain, pressure, shortness of breath.  Multiple sclerosis: Patient has paperwork for patient assistance for Rx.  No new neurologic deficits that she has noted.    At today's visit 11/15/18 ... PMH, PSH, FH reviewed and updated as needed.  Current medication list and allergy/intolerance hx reviewed and updated as needed. (See remainder of HPI, ROS, Phys Exam below)   No results found.  Results for orders placed or performed in visit on 11/15/18 (from the past 72 hour(s))  POCT HgB A1C     Status: Abnormal   Collection Time: 11/15/18  1:31 PM  Result Value Ref Range   Hemoglobin A1C 9.2 (A) 4.0 - 5.6 %   HbA1c POC (<> result, manual entry)     HbA1c, POC (prediabetic range)     HbA1c, POC (controlled diabetic range)            ASSESSMENT/PLAN: The primary encounter diagnosis was Uncontrolled type 2 diabetes mellitus with hyperglycemia ([place]). Diagnoses of Need for influenza vaccination, Hypertension associated with diabetes ([place]), Lumbar radicular pain, MS (multiple sclerosis) ([place]), Polyneuropathy associated with underlying disease ([place]), and Encounter for screening mammogram for malignant neoplasm of breast  were also pertinent to this visit.   Orders Placed This Encounter  Procedures  . MM 3D SCREEN BREAST BILATERAL  . Flu Vaccine QUAD 6+ mos PF IM (Fluarix Quad PF)  . POCT HgB A1C     Meds ordered this encounter  Medications  . lisinopril (ZESTRIL) 40 MG tablet    Sig: Take 1 tablet (40 mg total) by mouth daily.    Dispense:  90 tablet    Refill:  3  . hydrochlorothiazide (HYDRODIURIL) 25 MG tablet    Sig: Take 1 tablet (25 mg total) by mouth daily.    Dispense:  90 tablet    Refill:  3  . gabapentin (NEURONTIN) 300 MG capsule    Sig: TAKE 1 TO 2 CAPSULES BY MOUTH ONCE DAILY AT BEDTIME    Dispense:  90 capsule    Refill:  0  . cyclobenzaprine (FLEXERIL) 10 MG tablet    Sig: Take 0.5-1 tablets (5-10 mg total) by mouth at bedtime as needed.    Dispense:  90 tablet    Refill:  1  . metFORMIN (GLUCOPHAGE) 1000 MG tablet    Sig: Take 1 tablet (1,000 mg total) by mouth 2 (two) times daily.    Dispense:  180 tablet    Refill:  1    Patient Instructions  Blood pressure: To optimize control, let's increase to Lisinopril 40 mg, keep Hydrochlorothiazide at 25 mg. This is not available I na combination pill, but bot are inexpensive at [facility] so I sent separate Rx  MS: Will get paperwork done for the injections for MS  Diabetes: A1C today 9.2. Let's increase the Metformin! You are currently on 500 mg twice daily based on my review of the chart, please confirm this for [place]? Let's increase to 1000 mg twice daily and plan to recheck in 3 months. If A1C still not closer to goal (7 or less) we might need to add another medication. Watch diet! Exercise as able!   Anything else needed from your disability lawyers, please let me know!         Follow-up plan: Return in about 3 months (around 02/15/2019) for recheck A1C and blood pressure, see me sooner if needed!  .                                                 ################################################# ################################################# ################################################# #################################################    Current Meds  Medication Sig  . AMBULATORY NON FORMULARY MEDICATION Single glucometer with lancets, and test strips. Check morning fasting glucose daily and up to four times daily prn  . cyclobenzaprine (FLEXERIL) 10 MG tablet Take 0.5-1 tablets (5-10 mg total) by mouth at bedtime as needed.  Marland Kitchen emtricitabine-tenofovir (TRUVADA) 200-300 MG tablet Take 1 tablet by mouth daily.  Marland Kitchen gabapentin (NEURONTIN) 300 MG capsule TAKE 1 TO 2 CAPSULES BY MOUTH ONCE DAILY AT BEDTIME  . Glatiramer Acetate (COPAXONE) 40 MG/ML SOSY Inject 40 mg into the skin 3 (three) times a week. Rx Benefits: approved from 07/24/2018 to 07/23/2019, member ID [id number], [place] # [id number]  . HYDROcodone-acetaminophen (NORCO/VICODIN) 5-325 MG tablet TAKE 1 TABLET BY MOUTH AT BEDTIME AS NEEDED FOR PAIN  . omeprazole (PRILOSEC) 20 MG capsule Take 1 capsule (20 mg total) by mouth daily before breakfast.  . traMADol (ULTRAM) 50 MG tablet TAKE 1 TABLET BY MOUTH EVERY 6 HOURS AS NEEDED FOR  MODERATE  PAIN  OR  SEVERE  PAIN,  MAXIMUM  6  TABLETS  PER  DAY  . [DISCONTINUED] cyclobenzaprine (FLEXERIL) 5 MG tablet Take 5-10 mg by mouth at bedtime as needed.  . [DISCONTINUED] gabapentin (NEURONTIN) 300 MG capsule TAKE 1 TO 2 CAPSULES BY MOUTH ONCE DAILY AT BEDTIME  . [DISCONTINUED] lisinopril-hydrochlorothiazide (ZESTORETIC) 20-25 MG tablet Take 1 tablet by mouth daily.    Allergies  Allergen Reactions  . Other Anaphylaxis    Sunflower oils       Review of Systems:  Constitutional: No recent illness  HEENT: No  headache, no vision change  Cardiac: No  chest pain, No  pressure, No palpitations  Respiratory:  No  shortness of breath. No   Cough  Gastrointestinal: No  abdominal pain  Musculoskeletal: No new myalgia/arthralgia  Skin: No  Rash  Neurologic: No  weakness, No  Dizziness  Psychiatric: No  concerns with depression, No  concerns with anxiety  Exam:  BP (!) 142/93 (BP Location: Left Arm, Patient Position: Sitting, Cuff Size: Large)   Pulse 73   Temp 98.7 F (37.1 C) (Oral)   Wt 231 lb 12.8 oz (105.1 kg)   BMI 38.57 kg/m   Constitutional: VS see above. General Appearance: alert, well-developed, well-nourished, NAD  Eyes: Normal lids and conjunctive, non-icteric sclera  Neck: No masses, trachea midline.   Respiratory: Normal respiratory effort. no wheeze, no rhonchi, no rales  Cardiovascular: S1/S2 normal, no murmur, no rub/gallop auscultated. RRR.   Musculoskeletal: Gait normal. Symmetric and independent movement of all extremities  Neurological: Normal  balance/coordination. No tremor.  Skin: warm, dry, intact.   Psychiatric: Normal judgment/insight. Normal mood and affect. Oriented x3.       Visit summary with medication list and pertinent instructions was printed for patient to review, patient was advised to alert [place] if any updates are needed. All questions at time of visit were answered - patient instructed to contact office with any additional concerns. ER/RTC precautions were reviewed with the patient and understanding verbalized.   Note: Total time spent 25 minutes, greater than 50% of the visit was spent face-to-face counseling and coordinating care for the following: The primary encounter diagnosis was Need for influenza vaccination. Diagnoses of Uncontrolled type 2 diabetes mellitus with hyperglycemia ([place]), Hypertension associated with diabetes ([place]), Lumbar radicular pain, MS (multiple sclerosis) ([place]), Polyneuropathy associated with underlying disease ([place]), and Encounter for screening mammogram for malignant neoplasm of breast were also pertinent to this visit.Marland Kitchen  Please note: voice  recognition software was used to produce this document, and typos may escape review. Please contact Dr. Sheppard Coil for any needed clarifications.    Follow up plan: Return in about 3 months (around 02/15/2019) for recheck A1C and blood pressure, see me sooner if needed! Marland Kitchen

## 2018-11-21 ENCOUNTER — Other Ambulatory Visit: Payer: Self-pay

## 2018-11-21 ENCOUNTER — Ambulatory Visit (INDEPENDENT_AMBULATORY_CARE_PROVIDER_SITE_OTHER): Payer: Medicaid Other | Admitting: Sports Medicine

## 2018-11-21 DIAGNOSIS — M170 Bilateral primary osteoarthritis of knee: Secondary | ICD-10-CM

## 2018-11-21 NOTE — Assessment & Plan Note (Signed)
Right knee injection as above, previously injected in May. Return as needed.

## 2018-11-21 NOTE — Progress Notes (Signed)
Subjective:    CC: Right knee pain  HPI: Jeanette [name] is a pleasant 48 year old female with knee osteoarthritis, last injected in May 2020.  She is now having recurrence of pain, moderate, persistent, localized anteriorly without radiation, no mechanical symptoms, she did have a fall.  I reviewed the past medical history, family history, social history, surgical history, and allergies today and no changes were needed.  Please see the problem list section below in epic for further details.  Past Medical History: Past Medical History:  Diagnosis Date  . Abnormal Pap smear of cervix   . Anemia   . Dysmenorrhea   . Hemorrhoids   . Hypertension   . Menorrhagia   . Pelvic adhesions   . Peripheral neuropathy 01/21/2016  . Spinal stenosis at L4-L5 level 03/10/2016  . Tubo-ovarian abscess 10/2015  . White matter abnormality on MRI of brain 05/01/2018   Past Surgical History: Past Surgical History:  Procedure Laterality Date  . APPENDECTOMY    . ENDOMETRIAL ABLATION  08/16/2015  . MANDIBLE SURGERY    . OOPHORECTOMY Right 10/2015   for ovarian abscess   Social History: Social History   Socioeconomic History  . Marital status: Significant Other    Spouse name: Not on file  . Number of children: 5  . Years of education: Not on file  . Highest education level: Some college, no degree  Occupational History    Comment: [employer], [employer]  Social Needs  . Financial resource strain: Not on file  . Food insecurity    Worry: Not on file    Inability: Not on file  . Transportation needs    Medical: Not on file    Non-medical: Not on file  Tobacco Use  . Smoking status: Former Smoker    Quit date: 03/24/2015    Years since quitting: 3.6  . Smokeless tobacco: Never Used  Substance and Sexual Activity  . Alcohol use: Yes    Comment: social  . Drug use: No  . Sexual activity: Yes    Birth control/protection: Condom  Lifestyle  . Physical activity    Days per week: Not  on file    Minutes per session: Not on file  . Stress: Not on file  Relationships  . Social Herbalist on phone: Not on file    Gets together: Not on file    Attends religious service: Not on file    Active member of club or organization: Not on file    Attends meetings of clubs or organizations: Not on file    Relationship status: Not on file  Other Topics Concern  . Not on file  Social History Narrative   Recently relocated from [place] with her fiance   Caffeine- none   Family History: Family History  Problem Relation Age of Onset  . Diabetes Mother   . Stroke Mother   . Diabetes Father   . Hyperlipidemia Father   . Hypertension Father   . Heart attack Father   . Diabetes Brother   . Hypertension Son   . Heart attack Paternal Grandmother   . Heart attack Paternal Grandfather   . Brain cancer Brother   . Brain cancer Nephew   . Diabetes type I Niece   . Rickets Sister    Allergies: Allergies  Allergen Reactions  . Other Anaphylaxis    Sunflower oils   Medications: See med rec.  Review of Systems: No fevers, chills, night sweats, weight loss,  chest pain, or shortness of breath.   Objective:    General: Well Developed, well nourished, and in no acute distress.  Neuro: Alert and oriented x3, extra-ocular muscles intact, sensation grossly intact.  HEENT: Normocephalic, atraumatic, pupils equal round reactive to light, neck supple, no masses, no lymphadenopathy, thyroid nonpalpable.  Skin: Warm and dry, no rashes. Cardiac: Regular rate and rhythm, no murmurs rubs or gallops, no lower extremity edema.  Respiratory: Clear to auscultation bilaterally. Not using accessory muscles, speaking in full sentences. Right knee: Minimally swollen, palpable fluid wave, tenderness at the patellar facets. ROM normal in flexion and extension and lower leg rotation. Ligaments with solid consistent endpoints including ACL, PCL, LCL, MCL. Negative Mcmurray's and  provocative meniscal tests. Non painful patellar compression. Patellar and quadriceps tendons unremarkable. Hamstring and quadriceps strength is normal.  Procedure: Real-time Ultrasound Guided injection of the right knee Device: GE Logiq E  Verbal informed consent obtained.  Time-out conducted.  Noted no overlying erythema, induration, or other signs of local infection.  Skin prepped in a sterile fashion.  Local anesthesia: Topical Ethyl chloride.  With sterile technique and under real time ultrasound guidance:  1 cc Kenalog 40, 2 cc lidocaine, 2 cc bupivacaine injected easily Completed without difficulty  Pain immediately resolved suggesting accurate placement of the medication.  Advised to call if fevers/chills, erythema, induration, drainage, or persistent bleeding.  Images permanently stored and available for review in the ultrasound unit.  Impression: Technically successful ultrasound guided injection.  Impression and Recommendations:    Primary osteoarthritis of both knees Right knee injection as above, previously injected in May. Return as needed.   ___________________________________________ Gwen Her. Dianah Field, M.D., ABFM., CAQSM. Primary Care and Sports Medicine [facility]  Adjunct Professor of [place] [employer]

## 2018-11-21 NOTE — Telephone Encounter (Signed)
Patient finally dropped off the form and it was faxed. The patient was approved for the [facility] program. Forms sent to scan.

## 2018-11-22 ENCOUNTER — Telehealth: Payer: Self-pay

## 2018-11-22 NOTE — Telephone Encounter (Signed)
Pt called requesting an update for injectable medication for Jeanette [name] per pt, she filled out a pt's assistance form since she has no insurance. Pt is currently having MS symptoms and is requesting recommendation from provider. Pls advise, thanks.

## 2018-11-22 NOTE — Telephone Encounter (Signed)
Pt called requesting an update on pt's assistance program from [place] regarding Truvada rx. Forwarding to [place] for review. Thanks.

## 2018-11-23 ENCOUNTER — Telehealth: Payer: Self-pay | Admitting: Diagnostic Neuroimaging

## 2018-11-23 NOTE — Telephone Encounter (Signed)
Pt is calling in wanting to know if she can be signed up on the patient assistance program for Glatiramer Acetate (COPAXONE) 40 MG/ML SOSY. She states she lost her insurance when the pandemic started due to her loosing her job

## 2018-11-23 NOTE — Telephone Encounter (Signed)
I saw patient in June 2020. Ok to continue copaxone. pls setup follow up in next 6-8 months. -VRP

## 2018-11-23 NOTE — Telephone Encounter (Signed)
Left brief VM that patient assistance has been approved and she will receive a call form the patient assistance to set up shipment and for the patient to reach out to them.

## 2018-11-27 ENCOUNTER — Telehealth: Payer: Self-pay | Admitting: Osteopathic Medicine

## 2018-11-27 DIAGNOSIS — Z79899 Other long term (current) drug therapy: Secondary | ICD-10-CM

## 2018-11-27 DIAGNOSIS — Z206 Contact with and (suspected) exposure to human immunodeficiency virus [HIV]: Secondary | ICD-10-CM

## 2018-11-27 MED ORDER — TRUVADA 200-300 MG PO TABS: 1.0000 | ORAL_TABLET | Freq: Every day | ORAL | 1 refills | Status: DC

## 2018-11-27 MED ORDER — GLATIRAMER ACETATE 40 MG/ML ~~LOC~~ SOSY: 40.0000 mg | PREFILLED_SYRINGE | SUBCUTANEOUS | 11 refills | Status: DC

## 2018-11-27 NOTE — Telephone Encounter (Signed)
Sent!  To [place] as Juluis Rainier

## 2018-11-27 NOTE — Telephone Encounter (Signed)
Called patient and advised her that I have [employer] PAP forms ready for her to sign. Advised I need copy of her W2 tax form to send with application. She did state she has Medicaid family planning insurance which does not cover this type of drug. She stated she can come this Wed afternoon. I advised she wear a mask and tell front desk to send me a message. Patient verbalized understanding, appreciation.  Letter was edited to reflect she has Medicaid family planning insurance.

## 2018-11-27 NOTE — Telephone Encounter (Signed)
Letter signed, waiting on patient to come Wed, bring W2 and sign [employer] PAP forms.

## 2018-11-27 NOTE — Telephone Encounter (Signed)
Pt called. Patient Assistant Program has approved her Truvada for 1 year but they need you to write the prescription.   Thanks.

## 2018-11-27 NOTE — Telephone Encounter (Signed)
Phone rep checked office voicemail; @ 10:33 pt left a message for RN Stanton Kidney C to call her back re: her insurance.

## 2018-11-27 NOTE — Telephone Encounter (Signed)
Called patient who stated she's had no insurance since June 30 th, last took generic copaxone - glatiramer acetate 3 weeks ago. It had been approved with [employer] for 1 year. She spaced out her doses to extend it that far. She has applied for disability, is going through process with attorney now. She stated with MS and spinal stenosis, she is unable to work at all. I advised her I will get necessary paperwork together; she will have to sign papers, give financial information, and I'll call her back with instructions. She verbalized understanding, appreciation. Got [employer] PAP application ready, placed on Dr [employer] desk for signatures on PAP application, Rx for Glatiramer Acetate, letter re: patient has no insurance at this time.  Called patient to discuss and LVM requesting call back.

## 2018-11-29 NOTE — Telephone Encounter (Signed)
Patient came into office, gave copy of W2. Explained [employer] PAP forms to her, and she signed. Explained that if she is approved the medicine is shipped to this office, and she'll get a call to pick it up. She verbalized understanding, appreciation. PAP forms, copy of W2, copy of Medicaid FP card, Rx, letter faxed to [employer] PAP.

## 2018-12-07 ENCOUNTER — Other Ambulatory Visit: Payer: Self-pay

## 2018-12-07 ENCOUNTER — Ambulatory Visit (INDEPENDENT_AMBULATORY_CARE_PROVIDER_SITE_OTHER): Payer: Self-pay

## 2018-12-07 DIAGNOSIS — Z1231 Encounter for screening mammogram for malignant neoplasm of breast: Secondary | ICD-10-CM

## 2018-12-11 NOTE — Telephone Encounter (Signed)
Received 3 month supply of glatiramir acetate Inj 40mg /ml Batch [id number] (3 boxes) [place] [phone].  [phone] Delivery # [id number] PO # [id number].    I called pt and relayed that received her glatiramir acetate inj for her to pick up.  Relayed that not here on Tuesday, but can pick up today ro wed thru.  She verbalized understanding.

## 2018-12-13 ENCOUNTER — Telehealth: Payer: Self-pay

## 2018-12-13 NOTE — Telephone Encounter (Signed)
Patient came on 12-11-2018 and picked up her Glatiramer Acetate (COPAXONE) 40 MG/ML SOSY. Patient knew how to use the injections. No other questions or concerns at this time.

## 2018-12-18 ENCOUNTER — Other Ambulatory Visit: Payer: Self-pay | Admitting: Sports Medicine

## 2018-12-18 DIAGNOSIS — M48061 Spinal stenosis, lumbar region without neurogenic claudication: Secondary | ICD-10-CM

## 2019-01-17 ENCOUNTER — Other Ambulatory Visit: Payer: Self-pay | Admitting: Osteopathic Medicine

## 2019-01-17 DIAGNOSIS — M5416 Radiculopathy, lumbar region: Secondary | ICD-10-CM

## 2019-01-31 ENCOUNTER — Other Ambulatory Visit: Payer: Self-pay | Admitting: Sports Medicine

## 2019-01-31 DIAGNOSIS — M48061 Spinal stenosis, lumbar region without neurogenic claudication: Secondary | ICD-10-CM

## 2019-03-13 ENCOUNTER — Telehealth: Payer: Self-pay | Admitting: Diagnostic Neuroimaging

## 2019-03-13 MED ORDER — GLATIRAMER ACETATE 40 MG/ML ~~LOC~~ SOSY: 40.0000 mg | PREFILLED_SYRINGE | SUBCUTANEOUS | 11 refills | Status: DC

## 2019-03-13 NOTE — Telephone Encounter (Addendum)
LVM requesting call back to discuss free glatiramer acetate, also need any new insurance information.  Patient immediately called back. She stated she has no insurance at all, is still trying to get disability. I informed her that every 3 months a replenishment form has to be filled out and faxed to [facility] in order for her to continue to receive free medication. I will complete form today and fax, then call [facility] to advise them. Her last injection will be on 03/26/19. I informed her we are closed on Fri, but she'll get a call as soon as drug arrives. She verbalized understanding, appreciation. [facility] replenishment form and Rx for glatiramer acetate on Dr [employer] desk for signatures.

## 2019-03-13 NOTE — Telephone Encounter (Signed)
Completed, signed mylan replenishment form and Rx faxed to [facility], f [phone], spoke with Gay Filler and advised her of fax sent in and that patient will run out of drug Monday. She stated she will watch for ax and mark it as urgent. She will call back in 30 minutes if she doesn't receive fax, verbalized understanding.

## 2019-03-13 NOTE — Addendum Note (Signed)
Addended by: Minna Antis on: 03/13/2019 01:47 PM   Modules accepted: Orders

## 2019-03-13 NOTE — Telephone Encounter (Signed)
Pt has called to inform RN that she is down to about 3 dosages of the Glatiramer Acetate (COPAXONE) 40 MG/ML SOSY.  Pt is asking for a call from RN to discuss.

## 2019-03-15 ENCOUNTER — Encounter: Payer: Self-pay | Admitting: *Deleted

## 2019-03-15 NOTE — Telephone Encounter (Addendum)
Called [facility], spoke with Lovena Le to check status of glatiramer acetate. She checked with shipping, came back on line and stated she was told the order was placed this morning, and it should be delivered tomorrow. I advised Lovena Le our office is only open till 12:30- 1 pm tomorrow. I advised her I felt sure I had mentioned this when I called earlier in this week. Lovena Le put me on hold and stated she would talk to shipping dept. She came back on line and stated she  talked to shipping and was told they'd call [employer] today and tell [employer] if they can't deliver tomorrow to deliver on Monday. Lovena Le said no tracking # was available to check on delivery time.   I advised RNs working tomorrow.

## 2019-03-16 NOTE — Telephone Encounter (Signed)
Late entry from this am: Pt picked up 3 boxes of glatiramer acetate 40mg /ml (12 syringes in each box). Lot: [id number]. Expiration: 07/2020. [place]: [phone].

## 2019-03-16 NOTE — Telephone Encounter (Signed)
I called patient and left a message that the medication has been delivered and she may come pick it up today but that we close at 12:00PM.  Otherwise she may pick it up on Monday.

## 2019-03-16 NOTE — Telephone Encounter (Signed)
Emma assisted pt with picking up her copaxone at our office. Pt has medications that were deliver.

## 2019-03-16 NOTE — Telephone Encounter (Signed)
I called pt that her Copaxone was deliver to our office. I stated the office closed at 1200pm today. Pt stated she will be there at 1100 to pick up medications. I advise her to wear a mask and have tD to receive COpaxone. She verbalized understanding.

## 2019-03-24 ENCOUNTER — Other Ambulatory Visit: Payer: Self-pay | Admitting: Osteopathic Medicine

## 2019-03-24 DIAGNOSIS — M5416 Radiculopathy, lumbar region: Secondary | ICD-10-CM

## 2019-04-02 ENCOUNTER — Other Ambulatory Visit: Payer: Self-pay | Admitting: Sports Medicine

## 2019-04-02 DIAGNOSIS — M48061 Spinal stenosis, lumbar region without neurogenic claudication: Secondary | ICD-10-CM

## 2019-04-09 ENCOUNTER — Ambulatory Visit: Payer: Self-pay | Admitting: Diagnostic Neuroimaging

## 2019-04-17 ENCOUNTER — Ambulatory Visit: Payer: Self-pay | Admitting: Diagnostic Neuroimaging

## 2019-04-19 ENCOUNTER — Ambulatory Visit: Payer: Medicaid Other | Admitting: Osteopathic Medicine

## 2019-04-26 ENCOUNTER — Encounter: Payer: Self-pay | Admitting: Osteopathic Medicine

## 2019-04-26 ENCOUNTER — Ambulatory Visit (INDEPENDENT_AMBULATORY_CARE_PROVIDER_SITE_OTHER): Payer: Self-pay | Admitting: Osteopathic Medicine

## 2019-04-26 VITALS — BP 154/93 | HR 64 | Temp 97.8°F | Wt 231.0 lb

## 2019-04-26 DIAGNOSIS — E1159 Type 2 diabetes mellitus with other circulatory complications: Secondary | ICD-10-CM

## 2019-04-26 DIAGNOSIS — F418 Other specified anxiety disorders: Secondary | ICD-10-CM

## 2019-04-26 DIAGNOSIS — G35 Multiple sclerosis: Secondary | ICD-10-CM

## 2019-04-26 DIAGNOSIS — I152 Hypertension secondary to endocrine disorders: Secondary | ICD-10-CM

## 2019-04-26 DIAGNOSIS — E1165 Type 2 diabetes mellitus with hyperglycemia: Secondary | ICD-10-CM

## 2019-04-26 DIAGNOSIS — I10 Essential (primary) hypertension: Secondary | ICD-10-CM

## 2019-04-26 LAB — POCT GLYCOSYLATED HEMOGLOBIN (HGB A1C): Hemoglobin A1C: 8.2 % — AB (ref 4.0–5.6)

## 2019-04-26 MED ORDER — HYDROCHLOROTHIAZIDE 25 MG PO TABS: 25.0000 mg | ORAL_TABLET | Freq: Every day | ORAL | 3 refills | Status: DC

## 2019-04-26 MED ORDER — SERTRALINE HCL 25 MG PO TABS: 25.0000 mg | ORAL_TABLET | Freq: Every day | ORAL | 0 refills | Status: DC

## 2019-04-26 NOTE — Progress Notes (Signed)
Jeanette [name] is a 49 y.o. female who presents to  [facility]  today, 04/26/19, seeking care for the following: . DM2 - pt wouldn't be surprised if A1C still above goal because...  . Mental Health - depression, frequently crying, lacking motivation.  . BP - stopped hctz, stayed on lisinopril, BP elevated today      ASSESSMENT & PLAN with other pertinent history/findings:  The primary encounter diagnosis was Uncontrolled type 2 diabetes mellitus with hyperglycemia ([place]). Diagnoses of Hypertension associated with diabetes ([place]), MS (multiple sclerosis) ([place]), and Depression with anxiety were also pertinent to this visit.  Last A1C 11/15/18: 9.2 --> increased Metformin Today 04/26/19 A1C is 8.2   Patient Instructions  COVID vaccine:   recommended as soon as you're eligible! Please follow [email or web address] for updates on eligibility and vaccination appointment. Can also Google "[facility] COVID vaccine" and this should connect you with [facility] vaccine program.   As of now, we do not anticipate our clinic having vaccines anytime soon.     Mental health:  We are starting a medication today called Zoloft to help treat your anxiety/depression. This is a daily medication to help control your symptoms.   Expect a call or message form this office in the next 2 weeks to check in: If you're doing well on the medicine but not feeling any effect, we can increase the dose. If you're starting to feel some effect/improvement, we can hold off on a dose increase and reevaluate at your office visit.   Let's plan to follow up in the office in 4 weeks. At that time, we can talk about how well the medicine is working for you, and we can consider increasing the dose, adding another medicine, etc.   If you experience problematic side effects, please let me know ASAP - we can switch the medicine any time, and we don't need an appointment for this.   Any  questions or concerns, call me!      Orders Placed This Encounter  Procedures  . POCT HgB A1C    Meds ordered this encounter  Medications  . sertraline (ZOLOFT) 25 MG tablet    Sig: Take 1 tablet (25 mg total) by mouth daily.    Dispense:  90 tablet    Refill:  0  . hydrochlorothiazide (HYDRODIURIL) 25 MG tablet    Sig: Take 1 tablet (25 mg total) by mouth daily.    Dispense:  90 tablet    Refill:  3       Follow-up instructions: Return in about 4 weeks (around 05/24/2019) for IN-OFFICE VISIT RECHECK MENTAL HEALTH .                                         BP (!) 154/93 (BP Location: Left Arm, Patient Position: Sitting, Cuff Size: Large)   Pulse 64   Temp 97.8 F (36.6 C) (Oral)   Wt 231 lb (104.8 kg)   BMI 38.44 kg/m   Current Meds  Medication Sig  . AMBULATORY NON FORMULARY MEDICATION Single glucometer with lancets, and test strips. Check morning fasting glucose daily and up to four times daily prn  . cyclobenzaprine (FLEXERIL) 10 MG tablet Take 0.5-1 tablets (5-10 mg total) by mouth at bedtime as needed.  Marland Kitchen emtricitabine-tenofovir (TRUVADA) 200-300 MG tablet Take 1 tablet by mouth daily.  Marland Kitchen gabapentin (NEURONTIN)  300 MG capsule TAKE 1 TO 2 CAPSULES BY MOUTH ONCE DAILY AT BEDTIME  . Glatiramer Acetate (COPAXONE) 40 MG/ML SOSY Inject 40 mg into the skin 3 (three) times a week.  . hydrochlorothiazide (HYDRODIURIL) 25 MG tablet Take 1 tablet (25 mg total) by mouth daily.  Marland Kitchen lisinopril (ZESTRIL) 40 MG tablet Take 1 tablet (40 mg total) by mouth daily.  . metFORMIN (GLUCOPHAGE) 1000 MG tablet Take 1 tablet (1,000 mg total) by mouth 2 (two) times daily.  Marland Kitchen omeprazole (PRILOSEC) 20 MG capsule Take 1 capsule (20 mg total) by mouth daily before breakfast.  . traMADol (ULTRAM) 50 MG tablet TAKE 1 TABLET BY MOUTH EVERY 6 HOURS AS NEEDED FOR  MODERATE  PAIN.  MAXIMUM  SIX  TABLETS  PER  DAY    No results found for this or any previous visit  (from the past 3 hour(s)).  No results found.  Depression screen PHQ 2/9 10/25/2016  Decreased Interest 0  Down, Depressed, Hopeless 1  PHQ - 2 Score 1    No flowsheet data found.    All questions at time of visit were answered - patient instructed to contact office with any additional concerns or updates.  ER/RTC precautions were reviewed with the patient.  Please note: voice recognition software was used to produce this document, and typos may escape review. Please contact Dr. Sheppard Coil for any needed clarifications.   Total encounter time: 30 minutes.

## 2019-04-26 NOTE — Patient Instructions (Addendum)
COVID vaccine:   recommended as soon as you're eligible! Please follow [email or web address] for updates on eligibility and vaccination appointment. Can also Google "[facility] COVID vaccine" and this should connect you with [facility] vaccine program.   As of now, we do not anticipate our clinic having vaccines anytime soon.     Mental health:  We are starting a medication today called Zoloft to help treat your anxiety/depression. This is a daily medication to help control your symptoms.   Expect a call or message form this office in the next 2 weeks to check in: If you're doing well on the medicine but not feeling any effect, we can increase the dose. If you're starting to feel some effect/improvement, we can hold off on a dose increase and reevaluate at your office visit.   Let's plan to follow up in the office in 4 weeks. At that time, we can talk about how well the medicine is working for you, and we can consider increasing the dose, adding another medicine, etc.   If you experience problematic side effects, please let me know ASAP - we can switch the medicine any time, and we don't need an appointment for this.   Any questions or concerns, call me!

## 2019-04-30 ENCOUNTER — Encounter: Payer: Self-pay | Admitting: Osteopathic Medicine

## 2019-05-04 ENCOUNTER — Ambulatory Visit: Payer: Medicaid Other | Attending: Internal Medicine

## 2019-05-04 DIAGNOSIS — Z23 Encounter for immunization: Secondary | ICD-10-CM

## 2019-05-04 NOTE — Progress Notes (Signed)
   Covid-19 Vaccination Clinic  Name:  Lacretia Dicioccio    MRN: [id number] DOB: 09/26/1970  05/04/2019  Ms. Yelland was observed post Covid-19 immunization for 30 minutes without incident. She was provided with Vaccine Information Sheet and instruction to access the V-Safe system.   Ms. Lattner was instructed to call 911 with any severe reactions post vaccine: Marland Kitchen Difficulty breathing  . Swelling of face and throat  . A fast heartbeat  . A bad rash all over body  . Dizziness and weakness   Immunizations Administered    Name Date Dose VIS Date Route   Pfizer COVID-19 Vaccine 05/04/2019 12:18 PM 0.3 mL 01/19/2019 Intramuscular   Manufacturer: [place]   Lot: [id number]   [place]: [id number]

## 2019-05-11 ENCOUNTER — Other Ambulatory Visit: Payer: Self-pay | Admitting: Osteopathic Medicine

## 2019-05-12 ENCOUNTER — Other Ambulatory Visit: Payer: Self-pay | Admitting: Sports Medicine

## 2019-05-12 DIAGNOSIS — M48061 Spinal stenosis, lumbar region without neurogenic claudication: Secondary | ICD-10-CM

## 2019-05-21 ENCOUNTER — Encounter: Payer: Self-pay | Admitting: Diagnostic Neuroimaging

## 2019-05-21 ENCOUNTER — Ambulatory Visit: Payer: Self-pay | Admitting: Diagnostic Neuroimaging

## 2019-05-21 ENCOUNTER — Other Ambulatory Visit: Payer: Self-pay

## 2019-05-21 VITALS — BP 123/84 | HR 79 | Temp 97.2°F | Wt 228.6 lb

## 2019-05-21 DIAGNOSIS — M5124 Other intervertebral disc displacement, thoracic region: Secondary | ICD-10-CM

## 2019-05-21 DIAGNOSIS — G35 Multiple sclerosis: Secondary | ICD-10-CM

## 2019-05-21 DIAGNOSIS — M48062 Spinal stenosis, lumbar region with neurogenic claudication: Secondary | ICD-10-CM

## 2019-05-21 NOTE — Progress Notes (Signed)
Chief Complaint  Patient presents with  . Multiple Sclerosis    rm 7, "anxiety/nervousness/ cry alot, on Zoloft now; by end of night the pain in my back and right leg is bad from my spinal stenosis    History of Present Illness:  UPDATE (05/21/19, VRP): Since last visit, doing well; tolerating copaxone. Symptoms are stable. Some dizziness and headaches. No alleviating or aggravating factors.   UPDATE (07/19/18, VRP): MS mimic labs ruled out; positive JCV ab; slightly elevated LFTs (unknown cause). Since last visit, doing about the same. Symptoms are stable. Severity is moderate. No alleviating or aggravating factors. Tolerating meds. Labs completed. Now with dx of multiple sclerosis. Planning to start disease modifying therapy.   PRIOR HPI (05/17/18): 49 year old female here for evaluation of headache, dizziness, abnormal MRI of the brain.  History of hypertension and diabetes on medication. History of PrEP / truvada.   2018 patient had onset of low back pain radiating to the right leg.  She was diagnosed with severe spinal stenosis.  This is managed conservatively.  This is continued to get worse over time.  She has numbness or weakness of her right leg.  In 2019 patient had onset of numbness around her right rib cage area, near her bra line on the right side, which has been persistent.  December 2019 patient had onset of nonspecific pressure headaches in the front and back of her head, pressure behind her eyes.  No nausea or vomiting.  No sensitivity light or sound.  No prior similar headaches.  January 2020 patient had onset of intermittent blurred vision, intermittent dizziness and balance difficulty, leaning to the left side.  As a result of the symptoms patient went to PCP and had MRI of the brain ordered.  This showed multiple round and ovoid periventricular, subcortical, T2 hyperintensities.  Facilitated diffusion and T1 black holes were noted.  Therefore possibility of  underlying demyelinating disease or multiple sclerosis was raised.  No abnormal enhancing lesions were noted.    Observations/Objective:  GENERAL EXAM/CONSTITUTIONAL: Vitals:  Vitals:   05/21/19 1553  BP: 123/84  Pulse: 79  Temp: (!) 97.2 F (36.2 C)  Weight: 228 lb 9.6 oz (103.7 kg)     Body mass index is 38.04 kg/m. Wt Readings from Last 3 Encounters:  05/21/19 228 lb 9.6 oz (103.7 kg)  04/26/19 231 lb (104.8 kg)  11/21/18 230 lb (104.3 kg)     Patient is in no distress; well developed, nourished and groomed; neck is supple  CARDIOVASCULAR:  Examination of carotid arteries is normal; no carotid bruits  Regular rate and rhythm, no murmurs  Examination of peripheral vascular system by observation and palpation is normal  EYES:  Ophthalmoscopic exam of optic discs and posterior segments is normal; no papilledema or hemorrhages  No exam data present  MUSCULOSKELETAL:  Gait, strength, tone, movements noted in Neurologic exam below  NEUROLOGIC: MENTAL STATUS:  No flowsheet data found.  awake, alert, oriented to person, place and time  recent and remote memory intact  normal attention and concentration  language fluent, comprehension intact, naming intact  fund of knowledge appropriate  CRANIAL NERVE:   2nd - no papilledema on fundoscopic exam  2nd, 3rd, 4th, 6th - pupils equal and reactive to light, visual fields full to confrontation, extraocular muscles intact, no nystagmus  5th - facial sensation symmetric  7th - facial strength symmetric  8th - hearing intact  9th - palate elevates symmetrically, uvula midline  11th -  shoulder shrug symmetric  12th - tongue protrusion midline  MOTOR:   normal bulk and tone, full strength in the BUE, BLE  SENSORY:   normal and symmetric to light touch, temperature, vibration  COORDINATION:   finger-nose-finger, fine finger movements normal  REFLEXES:   deep tendon reflexes present and  symmetric  GAIT/STATION:   narrow based gait; SLIGHTLY ANTALGIC GAIT    05/01/18 MRI brain - Periventricular greater than subcortical white matter lesions, without restricted diffusion or postcontrast enhancement, concerning for chronic multiple sclerosis. - No acute intracranial abnormality. No abnormal postcontrast enhancement. - No finding is identified which might contribute to headache.  05/29/18 MRI cervical spine - Signal abnormality within the right hemicord at C5 without postcontrast enhancement is most worrisome for demyelinating disease such as multiple sclerosis. - Cervical spondylosis as described above without central canal narrowing. Uncovertebral disease causing moderate to moderately severe foraminal narrowing is seen at C5-6 and C6-7.  05/29/18 MRI thoracic spine A large central and right paracentral disc extrusion at T7-8 contacts and deforms the cord. Edema within the cord subjacent to the disc extends within the right cord to approximately T8-9 level and could be secondary to cord compression but may also reflect demyelinating disease such as multiple sclerosis.  05/14/18 MRI lumbar spine 1. Diffuse lumbar spine spondylosis as described above. 2. At L4-5 there is a broad-based disc bulge with a broad central disc protrusion. Severe bilateral facet arthropathy. Severe spinal stenosis. Mild right foraminal stenosis. 3.  No acute osseous injury of the lumbar spine.   Assessment and Plan:  Dx:  1. MS (multiple sclerosis) ([place])   2. Thoracic disc herniation   3. Spinal stenosis of lumbar region with neurogenic claudication      multiple sclerosis (RRMS) - continue copaxone for now (safe; has slightly elevated LFTs; diabetes; JCV ab positive; may consider ocrevus in future)  T7-8 disc herniation --> thoracic spinal cord compression  - per Dr. Lynann Bologna (ortho)  L4-5 severe lumbar spinal stenosis  - per Dr. Lynann Bologna (ortho)  Follow Up  Instructions:  - Return in about 1 year (around 05/20/2020).     Penni Bombard, MD [id number], [id number] PM Certified in Neurology, Neurophysiology and Neuroimaging  [facility] [address], [place], [place] [address] [phone]

## 2019-05-22 ENCOUNTER — Other Ambulatory Visit: Payer: Self-pay | Admitting: *Deleted

## 2019-05-22 DIAGNOSIS — G35 Multiple sclerosis: Secondary | ICD-10-CM

## 2019-05-22 MED ORDER — GLATIRAMER ACETATE 40 MG/ML ~~LOC~~ SOSY: 40.0000 mg | PREFILLED_SYRINGE | SUBCUTANEOUS | 11 refills | Status: DC

## 2019-05-24 ENCOUNTER — Ambulatory Visit: Payer: Medicaid Other | Admitting: Osteopathic Medicine

## 2019-05-29 ENCOUNTER — Ambulatory Visit: Payer: Medicaid Other | Attending: Internal Medicine

## 2019-05-29 DIAGNOSIS — Z23 Encounter for immunization: Secondary | ICD-10-CM

## 2019-05-29 NOTE — Progress Notes (Signed)
   Covid-19 Vaccination Clinic  Name:  Jeanette [name]    MRN: [id number] DOB: 10/25/1970  05/29/2019  [name] was observed post Covid-19 immunization for 15 minutes without incident. She was provided with Vaccine Information Sheet and instruction to access the V-Safe system.   [name] was instructed to call 911 with any severe reactions post vaccine: Marland Kitchen Difficulty breathing  . Swelling of face and throat  . A fast heartbeat  . A bad rash all over body  . Dizziness and weakness   Immunizations Administered    Name Date Dose VIS Date Route   Pfizer COVID-19 Vaccine 05/29/2019 10:32 AM 0.3 mL 04/04/2018 Intramuscular   Manufacturer: [place]   Lot: [id number]   [place]: [id number]

## 2019-05-31 ENCOUNTER — Encounter: Payer: Self-pay | Admitting: Medical-Surgical

## 2019-06-02 ENCOUNTER — Other Ambulatory Visit: Payer: Self-pay | Admitting: Osteopathic Medicine

## 2019-06-02 DIAGNOSIS — Z79899 Other long term (current) drug therapy: Secondary | ICD-10-CM

## 2019-06-02 DIAGNOSIS — Z206 Contact with and (suspected) exposure to human immunodeficiency virus [HIV]: Secondary | ICD-10-CM

## 2019-06-05 ENCOUNTER — Telehealth: Payer: Self-pay | Admitting: *Deleted

## 2019-06-05 NOTE — Telephone Encounter (Signed)
On 05/22/19 ordered [facility] free drug- glatiramer acetate for patient. Received shipment today, 3 months supply. Medication is stored in refrigerator as required. Called patient who stated she will try to come pick up medication this week, but it may be next week. I advised her of office hours, she verbalized understanding, appreciation.

## 2019-06-06 NOTE — Telephone Encounter (Signed)
Patient came and picked up 3 months of Glatiramer Acetate. She understands she must take it home now and refrigerate. Patient verbalized understanding, appreciation.

## 2019-06-09 ENCOUNTER — Other Ambulatory Visit: Payer: Self-pay | Admitting: Sports Medicine

## 2019-06-09 DIAGNOSIS — M48061 Spinal stenosis, lumbar region without neurogenic claudication: Secondary | ICD-10-CM

## 2019-06-15 ENCOUNTER — Telehealth: Payer: Self-pay

## 2019-06-15 DIAGNOSIS — Z79899 Other long term (current) drug therapy: Secondary | ICD-10-CM

## 2019-06-15 DIAGNOSIS — Z206 Contact with and (suspected) exposure to human immunodeficiency virus [HIV]: Secondary | ICD-10-CM

## 2019-06-15 MED ORDER — EMTRICITABINE-TENOFOVIR DF 200-300 MG PO TABS: 1.0000 | ORAL_TABLET | Freq: Every day | ORAL | 0 refills | Status: DC

## 2019-06-15 NOTE — Telephone Encounter (Signed)
Rx approved and sent to pharmacy

## 2019-06-15 NOTE — Telephone Encounter (Signed)
Fax received from pharmacy regarding Truvada 200-300 mg tab stating "patient brand name is too expensive even with savings card, can you authorize generic now?" The Rx was written as DAW, but I have tee'd it up below un-checking the DAW box, and it is ready for review and approval/denial.

## 2019-06-26 DIAGNOSIS — Z0289 Encounter for other administrative examinations: Secondary | ICD-10-CM

## 2019-06-29 ENCOUNTER — Other Ambulatory Visit: Payer: Self-pay | Admitting: Sports Medicine

## 2019-06-29 DIAGNOSIS — M48061 Spinal stenosis, lumbar region without neurogenic claudication: Secondary | ICD-10-CM

## 2019-07-12 ENCOUNTER — Encounter: Payer: Self-pay | Admitting: Nurse Practitioner

## 2019-07-12 ENCOUNTER — Ambulatory Visit (INDEPENDENT_AMBULATORY_CARE_PROVIDER_SITE_OTHER): Payer: Medicaid Other | Admitting: Nurse Practitioner

## 2019-07-12 VITALS — BP 116/83 | HR 66 | Temp 98.0°F | Ht 65.0 in | Wt 222.3 lb

## 2019-07-12 DIAGNOSIS — N611 Abscess of the breast and nipple: Secondary | ICD-10-CM | POA: Diagnosis not present

## 2019-07-12 MED ORDER — MUPIROCIN 2 % EX OINT: TOPICAL_OINTMENT | CUTANEOUS | 3 refills | Status: DC

## 2019-07-12 MED ORDER — DOXYCYCLINE HYCLATE 100 MG PO TABS: 100.0000 mg | ORAL_TABLET | Freq: Two times a day (BID) | ORAL | 0 refills | Status: DC

## 2019-07-12 NOTE — Patient Instructions (Addendum)
Wash the area twice a day with antibacterial soap (dial bar soap is effective), dry the area gently by patting with a clean towel.   Apply the ointment to the area 3 times a day for 7 days.   Use warm compresses to the area three times a day for approximately 20 minutes at a time to help with drainage and to reduce pain.   If the area begins to drain, you can use the small gauze pads in your bra to keep the drainage from getting on your clothing. Allow the area to drain naturally. Do not squeeze the area, but you may apply gentle pressure to help with drainage.   If you begin to develop worsening pain, redness, irritation, fever, chills, body aches, or other changes to the breast tissue please let us know as this could be a sign that your are developing a worsening infection and may need additional antibiotics.    Skin Abscess  A skin abscess is an infected area of your skin that contains pus and other material. An abscess can happen in any part of your body. Some abscesses break open (rupture) on their own. Most continue to get worse unless they are treated. The infection can spread deeper into the body and into your blood, which can make you feel sick. A skin abscess is caused by germs that enter the skin through a cut or scrape. It can also be caused by blocked oil and sweat glands or infected hair follicles. This condition is usually treated by:  Draining the pus.  Taking antibiotic medicines.  Placing a warm, wet washcloth over the abscess. Follow these instructions at home: Medicines   Take over-the-counter and prescription medicines only as told by your doctor.  If you were prescribed an antibiotic medicine, take it as told by your doctor. Do not stop taking the antibiotic even if you start to feel better. Abscess care   If you have an abscess that has not drained, place a warm, clean, wet washcloth over the abscess several times a day. Do this as told by your doctor.  Follow  instructions from your doctor about how to take care of your abscess. Make sure you: ? Cover the abscess with a bandage (dressing). ? Change your bandage or gauze as told by your doctor. ? Wash your hands with soap and water before you change the bandage or gauze. If you cannot use soap and water, use hand sanitizer.  Check your abscess every day for signs that the infection is getting worse. Check for: ? More redness, swelling, or pain. ? More fluid or blood. ? Warmth. ? More pus or a bad smell. General instructions  To avoid spreading the infection: ? Do not share personal care items, towels, or hot tubs with others. ? Avoid making skin-to-skin contact with other people.  Keep all follow-up visits as told by your doctor. This is important. Contact a doctor if:  You have more redness, swelling, or pain around your abscess.  You have more fluid or blood coming from your abscess.  Your abscess feels warm when you touch it.  You have more pus or a bad smell coming from your abscess.  You have a fever.  Your muscles ache.  You have chills.  You feel sick. Get help right away if:  You have very bad (severe) pain.  You see red streaks on your skin spreading away from the abscess. Summary  A skin abscess is an infected area of your  skin that contains pus and other material.  The abscess is caused by germs that enter the skin through a cut or scrape. It can also be caused by blocked oil and sweat glands or infected hair follicles.  Follow your doctor's instructions on caring for your abscess, taking medicines, preventing infections, and keeping follow-up visits. This information is not intended to replace advice given to you by your health care provider. Make sure you discuss any questions you have with your health care provider. Document Revised: 08/31/2018 Document Reviewed: 03/10/2017 Elsevier Patient Education  2020 [employer].

## 2019-07-12 NOTE — Progress Notes (Signed)
Acute Office Visit  Subjective:    Patient ID: Jeanette [name], female    DOB: December 22, 1970, 49 y.o.   MRN: [id number]  Chief Complaint  Patient presents with  . Breast Mass    woke up Saturday, right nipple pain, bump present, getting larger, this morning it is white    HPI Patient is in today for small abscess on the right nipple that started on Saturday morning. She reports the area was very tender and red. Last night she took a bath and allowed the area to soak in hot water. When she woke this morning, the area is white in color.  She has not tried any topical medications. She has not attempted to rupture the area or squeeze.  She does not have any other areas of redness, inflammation, lumps, or skin changes to the breast.  She denies fever, chills, body aches, malaise.   Past Medical History:  Diagnosis Date  . Abnormal Pap smear of cervix   . Anemia   . Dysmenorrhea   . Hemorrhoids   . Hypertension   . Menorrhagia   . Pelvic adhesions   . Peripheral neuropathy 01/21/2016  . Spinal stenosis at L4-L5 level 03/10/2016  . Tubo-ovarian abscess 10/2015  . White matter abnormality on MRI of brain 05/01/2018    Past Surgical History:  Procedure Laterality Date  . APPENDECTOMY    . ENDOMETRIAL ABLATION  08/16/2015  . MANDIBLE SURGERY    . OOPHORECTOMY Right 10/2015   for ovarian abscess    Family History  Problem Relation Age of Onset  . Diabetes Mother   . Stroke Mother   . Diabetes Father   . Hyperlipidemia Father   . Hypertension Father   . Heart attack Father   . Diabetes Brother   . Hypertension Son   . Heart attack Paternal Grandmother   . Heart attack Paternal Grandfather   . Brain cancer Brother   . Brain cancer Nephew   . Diabetes type I Niece   . Rickets Sister     Social History   Socioeconomic History  . Marital status: Significant Other    Spouse name: Not on file  . Number of children: 5  . Years of education: Not on file  . Highest education level:  Some college, no degree  Occupational History    Comment: [employer], [employer]  Tobacco Use  . Smoking status: Former Smoker    Quit date: 03/24/2015    Years since quitting: 4.3  . Smokeless tobacco: Never Used  Substance and Sexual Activity  . Alcohol use: Yes    Comment: social  . Drug use: No  . Sexual activity: Yes    Birth control/protection: Condom  Other Topics Concern  . Not on file  Social History Narrative   Recently relocated from [place] with her fiance   Caffeine- none   Social Determinants of Health   Financial Resource Strain:   . Difficulty of Paying Living Expenses:   Food Insecurity:   . Worried About Charity fundraiser in the Last Year:   . Arboriculturist in the Last Year:   Transportation Needs:   . Film/video editor (Medical):   Marland Kitchen Lack of Transportation (Non-Medical):   Physical Activity:   . Days of Exercise per Week:   . Minutes of Exercise per Session:   Stress:   . Feeling of Stress :   Social Connections:   . Frequency of Communication with Friends and  Family:   . Frequency of Social Gatherings with Friends and Family:   . Attends Religious Services:   . Active Member of Clubs or Organizations:   . Attends Archivist Meetings:   Marland Kitchen Marital Status:   Intimate Partner Violence:   . Fear of Current or Ex-Partner:   . Emotionally Abused:   Marland Kitchen Physically Abused:   . Sexually Abused:     Outpatient Medications Prior to Visit  Medication Sig Dispense Refill  . AMBULATORY NON FORMULARY MEDICATION Single glucometer with lancets, and test strips. Check morning fasting glucose daily and up to four times daily prn 1 each 0  . emtricitabine-tenofovir (TRUVADA) 200-300 MG tablet Take 1 tablet by mouth daily. 90 tablet 0  . gabapentin (NEURONTIN) 300 MG capsule TAKE 1 TO 2 CAPSULES BY MOUTH ONCE DAILY AT BEDTIME 90 capsule 2  . Glatiramer Acetate (COPAXONE) 40 MG/ML SOSY Inject 40 mg into the skin 3 (three) times a week.  12 mL 11  . hydrochlorothiazide (HYDRODIURIL) 25 MG tablet Take 1 tablet (25 mg total) by mouth daily. 90 tablet 3  . lisinopril (ZESTRIL) 40 MG tablet Take 1 tablet (40 mg total) by mouth daily. 90 tablet 3  . metFORMIN (GLUCOPHAGE) 1000 MG tablet Take 1 tablet by mouth twice daily 180 tablet 1  . omeprazole (PRILOSEC) 20 MG capsule Take 1 capsule (20 mg total) by mouth daily before breakfast. 90 capsule 1  . sertraline (ZOLOFT) 25 MG tablet Take 1 tablet (25 mg total) by mouth daily. 90 tablet 0  . traMADol (ULTRAM) 50 MG tablet TAKE 1 TABLET BY MOUTH EVERY 6 HOURS AS NEEDED FOR MODERATE PAIN . DO NOT EXCEED 6 PER 24 HOURS 30 tablet 0   No facility-administered medications prior to visit.    Allergies  Allergen Reactions  . Other Anaphylaxis    Sunflower oils    Review of Systems  Constitutional: Negative for activity change, appetite change, chills, fatigue and fever.  Cardiovascular: Negative for chest pain and palpitations.  Gastrointestinal: Negative for diarrhea, nausea and vomiting.  Musculoskeletal: Negative for myalgias.  Skin: Positive for color change and wound.  Hematological: Negative for adenopathy. Does not bruise/bleed easily.       Objective:    Physical Exam Vitals and nursing note reviewed.  Constitutional:      Appearance: Normal appearance.  HENT:     Head: Normocephalic.  Eyes:     Extraocular Movements: Extraocular movements intact.     Conjunctiva/sclera: Conjunctivae normal.     Pupils: Pupils are equal, round, and reactive to light.  Cardiovascular:     Rate and Rhythm: Normal rate and regular rhythm.     Pulses: Normal pulses.  Pulmonary:     Effort: Pulmonary effort is normal.     Breath sounds: Normal breath sounds.  Chest:     Breasts:        Right: Skin change and tenderness present.        Left: Normal.    Musculoskeletal:        General: Normal range of motion.     Cervical back: Normal range of motion.  Lymphadenopathy:      Upper Body:     Right upper body: No supraclavicular, axillary or pectoral adenopathy.  Skin:    General: Skin is warm and dry.     Capillary Refill: Capillary refill takes less than 2 seconds.     Findings: Erythema present.  Neurological:     General: No focal  deficit present.     Mental Status: She is alert and oriented to person, place, and time.  Psychiatric:        Mood and Affect: Mood normal.        Behavior: Behavior normal.        Thought Content: Thought content normal.        Judgment: Judgment normal.     BP 116/83   Pulse 66   Temp 98 F (36.7 C) (Oral)   Ht 5\' 5"  (1.651 m)   Wt 222 lb 4.8 oz (100.8 kg)   SpO2 96%   BMI 36.99 kg/m  Wt Readings from Last 3 Encounters:  07/12/19 222 lb 4.8 oz (100.8 kg)  05/21/19 228 lb 9.6 oz (103.7 kg)  04/26/19 231 lb (104.8 kg)    Health Maintenance Due  Topic Date Due  . OPHTHALMOLOGY EXAM  07/04/2019    There are no preventive care reminders to display for this patient.   Lab Results  Component Value Date   TSH 0.49 01/21/2016   Lab Results  Component Value Date   WBC 4.8 06/28/2018   HGB 13.0 06/28/2018   HCT 38.1 06/28/2018   MCV 91 06/28/2018   PLT 235 06/28/2018   Lab Results  Component Value Date   NA 135 06/28/2018   K 4.3 06/28/2018   CO2 22 06/28/2018   GLUCOSE 290 (H) 06/28/2018   BUN 10 06/28/2018   CREATININE 0.54 (L) 06/28/2018   BILITOT 0.2 06/28/2018   ALKPHOS 96 06/28/2018   AST 60 (H) 06/28/2018   ALT 91 (H) 06/28/2018   PROT 6.4 06/28/2018   ALBUMIN 4.3 06/28/2018   CALCIUM 9.1 06/28/2018   Lab Results  Component Value Date   CHOL 182 01/21/2016   Lab Results  Component Value Date   HDL 48 (L) 01/21/2016   Lab Results  Component Value Date   LDLCALC 121 (H) 01/21/2016   Lab Results  Component Value Date   TRIG 67 01/21/2016   Lab Results  Component Value Date   CHOLHDL 3.8 01/21/2016   Lab Results  Component Value Date   HGBA1C 8.2 (A) 04/26/2019         Assessment & Plan:   1. Abscess of breast Presentation and symptoms consistent with subcutaneous abscess to the right nipple at the 7 o'clock position. The skin does appear to be thinning and I suspect it will rupture on its own in the near future. Will not lance abscess today due to location- I feel that natural opening and drainage will be best for the healing process.   PLAN: -Doxycycline 100mg  BID x7 days  -Mupirocin ointment applied TID to the area x7 days -Wash area with dial soap and pat dry twice a day -No not squeeze area. If natural opening and drainage occur, gentle pressure may be applied to facilitate drainage. Use gauze pads to protect clothing from drainage and continue to use mupirocin ointment.  -Apply warm compresses 3-4 times a day for at least 20 minutes to help with healing and drainage.  -If symptoms worsen, fever, red streaks, chills, body aches, new symptoms, or if you have any concerns, please let us know immediately.   - doxycycline (VIBRA-TABS) 100 MG tablet; Take 1 tablet (100 mg total) by mouth 2 (two) times daily.  Dispense: 14 tablet; Refill: 0 - mupirocin ointment (BACTROBAN) 2 %; Apply to affected area TID for 7 days.  Dispense: 30 g; Refill: 3   Orma Render, NP

## 2019-07-19 ENCOUNTER — Ambulatory Visit: Payer: Medicaid Other | Admitting: Sports Medicine

## 2019-07-22 ENCOUNTER — Other Ambulatory Visit: Payer: Self-pay | Admitting: Sports Medicine

## 2019-07-22 DIAGNOSIS — M48061 Spinal stenosis, lumbar region without neurogenic claudication: Secondary | ICD-10-CM

## 2019-08-01 ENCOUNTER — Other Ambulatory Visit: Payer: Self-pay | Admitting: Physician Assistant

## 2019-08-01 DIAGNOSIS — Z791 Long term (current) use of non-steroidal anti-inflammatories (NSAID): Secondary | ICD-10-CM

## 2019-08-09 NOTE — Telephone Encounter (Signed)
Please contact patient, due for diabetes follow up and also ask if PAP has been done- if not needs scheduling for that also

## 2019-08-10 NOTE — Telephone Encounter (Signed)
Appointment has been made. No further questions.  °

## 2019-08-15 ENCOUNTER — Telehealth: Payer: Self-pay | Admitting: *Deleted

## 2019-08-15 DIAGNOSIS — G35 Multiple sclerosis: Secondary | ICD-10-CM

## 2019-08-15 MED ORDER — GLATIRAMER ACETATE 40 MG/ML ~~LOC~~ SOSY: 40.0000 mg | PREFILLED_SYRINGE | SUBCUTANEOUS | 11 refills | Status: DC

## 2019-08-15 NOTE — Telephone Encounter (Signed)
PAP form signed, Rx signed and faxed to [facility].

## 2019-08-15 NOTE — Telephone Encounter (Signed)
Glatiramer Acetate Rx and [employer] PAP replenishment form on MD's desk for signature.

## 2019-08-21 ENCOUNTER — Other Ambulatory Visit: Payer: Self-pay | Admitting: Sports Medicine

## 2019-08-21 DIAGNOSIS — M48061 Spinal stenosis, lumbar region without neurogenic claudication: Secondary | ICD-10-CM

## 2019-08-22 NOTE — Telephone Encounter (Signed)
Called Jeanette [name], spoke with Gay Filler to check status of glatiramer acetate injections, replenishment form. She stated it is in process for shipment, and should arrive early next week. Sent my chart to advise patient.

## 2019-08-27 ENCOUNTER — Other Ambulatory Visit: Payer: Self-pay

## 2019-08-27 ENCOUNTER — Ambulatory Visit: Payer: Self-pay

## 2019-08-27 ENCOUNTER — Ambulatory Visit (INDEPENDENT_AMBULATORY_CARE_PROVIDER_SITE_OTHER): Payer: Medicaid Other | Admitting: Sports Medicine

## 2019-08-27 DIAGNOSIS — M170 Bilateral primary osteoarthritis of knee: Secondary | ICD-10-CM | POA: Diagnosis not present

## 2019-08-27 DIAGNOSIS — M48062 Spinal stenosis, lumbar region with neurogenic claudication: Secondary | ICD-10-CM

## 2019-08-27 NOTE — Progress Notes (Signed)
    Procedures performed today:    Procedure: Real-time Ultrasound Guided injection of the right knee Device: Samsung HS60  Verbal informed consent obtained.  Time-out conducted.  Noted no overlying erythema, induration, or other signs of local infection.  Skin prepped in a sterile fashion.  Local anesthesia: Topical Ethyl chloride.  With sterile technique and under real time ultrasound guidance: 1 cc Kenalog 40, 2 cc lidocaine, 2 cc bupivacaine injected easily Completed without difficulty  Pain immediately resolved suggesting accurate placement of the medication.  Advised to call if fevers/chills, erythema, induration, drainage, or persistent bleeding.  Images permanently stored and available for review in the ultrasound unit.  Impression: Technically successful ultrasound guided injection.  Independent interpretation of notes and tests performed by another provider:   None.  Brief History, Exam, Impression, and Recommendations:    Primary osteoarthritis of both knees Right knee osteoarthritis, mild effusion, tenderness at the posterior lateral joint line, injected today, this was last injected in October 2020.  Spinal stenosis of lumbar region with neurogenic claudication [name] has also had severe spinal stenosis, progressive quadriceps weakness and foot drop on the right. She did touch base with Dr. Lynann Bologna who wanted to operate, she did not have the procedure done. She understands that [name] foot drop will likely not resolve, she does have a significant and classic slapping gait of foot drop, I am going to get [name] set up with a carbon fiber AFO for the right side. She does tend to trip.    ___________________________________________ Jeanette [name]. Dianah Field, M.D., ABFM., CAQSM. Primary Care and [place] Instructor of [place] [facility] of Medicine

## 2019-08-27 NOTE — Assessment & Plan Note (Signed)
Right knee osteoarthritis, mild effusion, tenderness at the posterior lateral joint line, injected today, this was last injected in October 2020.

## 2019-08-27 NOTE — Addendum Note (Signed)
Addended by: Cyd Silence on: 08/27/2019 12:05 PM   Modules accepted: Orders

## 2019-08-27 NOTE — Addendum Note (Signed)
Addended by: Cyd Silence on: 08/27/2019 12:16 PM   Modules accepted: Orders

## 2019-08-27 NOTE — Addendum Note (Signed)
Addended by: Cyd Silence on: 08/27/2019 02:49 PM   Modules accepted: Orders

## 2019-08-27 NOTE — Assessment & Plan Note (Signed)
Jeanette [name] has also had severe spinal stenosis, progressive quadriceps weakness and foot drop on the right. She did touch base with Dr. Lynann Bologna who wanted to operate, she did not have the procedure done. She understands that her foot drop will likely not resolve, she does have a significant and classic slapping gait of foot drop, I am going to get her set up with a carbon fiber AFO for the right side. She does tend to trip.

## 2019-08-28 NOTE — Telephone Encounter (Signed)
Called [facility] for update on glatiramer acetate shipment, spoke with Lexine Baton who stated Gay Filler is at lunch, and she will check with Gay Filler and call me back.

## 2019-08-29 ENCOUNTER — Encounter: Payer: Self-pay | Admitting: *Deleted

## 2019-08-29 NOTE — Telephone Encounter (Signed)
Glatiramer Acetate 3 boxes arrived, were labeled and put in drug refrigerator. I sent her my chart to advise her and ask if she can pick up tomorrow.

## 2019-08-30 NOTE — Telephone Encounter (Addendum)
Called patient and informed her 3 boxes of glatiramer acetate is here for her to pick up. She'll either come today or Monday. She then reported new onset of headaches. She has appointment with PCP Monday; I advised she discuss with PCP. She then stated she hasn't received bill from her April office visit here. She would like billing to call her so she doesn't get behind in paying her bill. Her disability is still pending. I advised will send message to billing. Patient verbalized understanding, appreciation.

## 2019-09-03 ENCOUNTER — Ambulatory Visit: Payer: Medicaid Other | Admitting: Osteopathic Medicine

## 2019-09-03 ENCOUNTER — Telehealth: Payer: Self-pay | Admitting: Osteopathic Medicine

## 2019-09-03 ENCOUNTER — Other Ambulatory Visit: Payer: Self-pay

## 2019-09-03 NOTE — Telephone Encounter (Signed)
Pt called at 8:50 to cancel her 1:40 appt.

## 2019-09-03 NOTE — Telephone Encounter (Signed)
noted 

## 2019-09-03 NOTE — Telephone Encounter (Signed)
Please call patient: she has had 2 no-shows to the clinic in the past year: see below. Cancellations <24 hours notice count as no-shows.   Please let her know - Patients are permitted 3 no-shows before dismissal from this practice. When signing new patient paperwork at this office, they were informed of this policy and signed paperwork signifying understanding of this policy. If you have questions about this clinic policy, please connect them w/ office manager.  No Show w/ Evlyn Clines, 10/24/18 NO SHOW - LATE CANCEL DR Sheppard Coil 09/03/19

## 2019-09-03 NOTE — Telephone Encounter (Signed)
Next no-show of any kind = dismissal. Will see!

## 2019-09-03 NOTE — Telephone Encounter (Signed)
Patient said she called at 8:50am to cancel and she did reschedule. She could not make it this afternoon due to her neurology appointment. States that she is aware of our policy but she just wanted to tell us that she did call early to cancel.

## 2019-09-03 NOTE — Telephone Encounter (Signed)
Pt aware.

## 2019-09-03 NOTE — Telephone Encounter (Signed)
Patient came into office and picked up Glatiramer Acetate, three boxes.

## 2019-09-07 ENCOUNTER — Other Ambulatory Visit: Payer: Self-pay | Admitting: Sports Medicine

## 2019-09-07 DIAGNOSIS — M48061 Spinal stenosis, lumbar region without neurogenic claudication: Secondary | ICD-10-CM

## 2019-09-17 ENCOUNTER — Encounter: Payer: Self-pay | Admitting: Osteopathic Medicine

## 2019-09-17 ENCOUNTER — Ambulatory Visit (INDEPENDENT_AMBULATORY_CARE_PROVIDER_SITE_OTHER): Payer: Medicaid Other | Admitting: Osteopathic Medicine

## 2019-09-17 ENCOUNTER — Other Ambulatory Visit: Payer: Self-pay

## 2019-09-17 VITALS — BP 113/77 | HR 76 | Wt 226.0 lb

## 2019-09-17 DIAGNOSIS — E1159 Type 2 diabetes mellitus with other circulatory complications: Secondary | ICD-10-CM | POA: Diagnosis not present

## 2019-09-17 DIAGNOSIS — I10 Essential (primary) hypertension: Secondary | ICD-10-CM

## 2019-09-17 DIAGNOSIS — F325 Major depressive disorder, single episode, in full remission: Secondary | ICD-10-CM | POA: Diagnosis not present

## 2019-09-17 DIAGNOSIS — I152 Hypertension secondary to endocrine disorders: Secondary | ICD-10-CM

## 2019-09-17 DIAGNOSIS — E1165 Type 2 diabetes mellitus with hyperglycemia: Secondary | ICD-10-CM

## 2019-09-17 LAB — POCT GLYCOSYLATED HEMOGLOBIN (HGB A1C)
HbA1c POC (<> result, manual entry): 9.6 % (ref 4.0–5.6)
HbA1c, POC (controlled diabetic range): 9.6 % — AB (ref 0.0–7.0)
HbA1c, POC (prediabetic range): 9.6 % — AB (ref 5.7–6.4)
Hemoglobin A1C: 9.6 % — AB (ref 4.0–5.6)

## 2019-09-17 MED ORDER — SERTRALINE HCL 25 MG PO TABS: 25.0000 mg | ORAL_TABLET | Freq: Every day | ORAL | 0 refills | Status: DC

## 2019-09-17 NOTE — Patient Instructions (Signed)
A1C above goal Work on diet/exercise  If A1C not dramatically better at next visit, will need to add Rx   Will restart Zoloft to address mental health / anxiety

## 2019-09-17 NOTE — Progress Notes (Signed)
Jeanette Jeanette [name] is a 49 y.o. female who presents to  [name] at [name]  today, 09/17/19, seeking care for the following: . DM2 - A1C above goal today, see below.  . Mental Health - previous visit we discussed depression, frequently crying, lacking motivation --> started Zoloft 25 mg. She's been off this for a bit and noted worsening anxiety and stress eating.  . BP - see below, BP at goal / improved      ASSESSMENT & PLAN with other pertinent history/findings:  The primary encounter diagnosis was Uncontrolled type 2 diabetes mellitus with hyperglycemia (Jeanette Jeanette [name]). Diagnoses of Hypertension associated with diabetes (Jeanette Jeanette [name]) and Major depressive disorder, single episode, in remission [name]) were also pertinent to this visit.    A1C 11/15/18: 9.2 --> increased Metformin Last visit 04/26/19 A1C was 8.2 Today 09/17/19 A1C is 9.6 --> offered add Glimepiride or similar (financial constraints preclude usual/preferred second-line therapy) but pt declined for now.   BP Readings from Last 3 Encounters:  09/17/19 113/77  07/12/19 116/83  05/21/19 123/84     Patient Instructions  A1C above goal Work on diet/exercise  If A1C not dramatically better at next visit, will need to add Rx   Will restart Zoloft to address mental health / anxiety       Orders Placed This Encounter  Procedures  . POCT HgB A1C    Meds ordered this encounter  Medications  . sertraline (ZOLOFT) 25 MG tablet    Sig: Take 1 tablet (25 mg total) by mouth daily.    Dispense:  90 tablet    Refill:  0       Follow-up instructions: Return for IN-OFFICE VISIT RECHECK A1C IN 4 MONTHS, SEE [place] SOONER IF NEEDED.                                         BP 113/77 (BP Location: Right Arm, Patient Position: Sitting)   Pulse 76   Wt 226 lb (102.5 kg)   SpO2 98%   BMI 37.61 kg/m   Current Meds  Medication Sig  . AMBULATORY NON FORMULARY  MEDICATION Single glucometer with lancets, and test strips. Check morning fasting glucose daily and up to four times daily prn  . gabapentin (NEURONTIN) 300 MG capsule TAKE 1 TO 2 CAPSULES BY MOUTH ONCE DAILY AT BEDTIME  . Glatiramer Acetate (COPAXONE) 40 MG/ML SOSY Inject 40 mg into the skin 3 (three) times a week.  . hydrochlorothiazide (HYDRODIURIL) 25 MG tablet Take 1 tablet (25 mg total) by mouth daily.  Marland Kitchen lisinopril (ZESTRIL) 40 MG tablet Take 1 tablet (40 mg total) by mouth daily.  . metFORMIN (GLUCOPHAGE) 1000 MG tablet Take 1 tablet by mouth twice daily  . omeprazole (PRILOSEC) 20 MG capsule TAKE 1 CAPSULE BY MOUTH ONCE DAILY BEFORE  BREAKFAST  . traMADol (ULTRAM) 50 MG tablet TAKE 1 TABLET BY MOUTH EVERY 6 HOURS AS NEEDED FOR MODERATE PAIN . DO NOT EXCEED 6 PER 24 HOURS    Results for orders placed or performed in visit on 09/17/19 (from the past 72 hour(s))  POCT HgB A1C     Status: Abnormal   Collection Time: 09/17/19 10:49 AM  Result Value Ref Range   Hemoglobin A1C 9.6 (A) 4.0 - 5.6 %   HbA1c POC (<> result, manual entry) 9.6 4.0 - 5.6 %   HbA1c, POC (  prediabetic range) 9.6 (A) 5.7 - 6.4 %   HbA1c, POC (controlled diabetic range) 9.6 (A) 0.0 - 7.0 %    No results found.  Depression screen [name] 2/9 09/17/2019 04/26/2019 10/25/2016  Decreased Interest 1 2 0  Down, Depressed, Hopeless 1 2 1   PHQ - 2 Score 2 4 1   Altered sleeping 3 3 -  Tired, decreased energy 2 3 -  Change in appetite 3 3 -  Feeling bad or failure about yourself  1 0 -  Trouble concentrating 1 2 -  Moving slowly or fidgety/restless 0 0 -  Suicidal thoughts 0 0 -  PHQ-9 Score 12 15 -  Difficult doing work/chores Somewhat difficult Somewhat difficult -    GAD 7 : Generalized Anxiety Score 09/17/2019 04/26/2019  Nervous, Anxious, on Edge 1 2  Control/stop worrying 1 2  Worry too much - different things 1 2  Trouble relaxing 1 2  Restless 1 2  Easily annoyed or irritable 2 2  Afraid - awful might happen 0 0   Total GAD 7 Score 7 12  Anxiety Difficulty Somewhat difficult Somewhat difficult      All questions at time of visit were answered - patient instructed to contact office with any additional concerns or updates.  ER/RTC precautions were reviewed with the patient.  Please note: voice recognition software was used to produce this document, and typos may escape review. Please contact Dr. Sheppard Coil for any needed clarifications.   Total encounter time: 30 minutes.

## 2019-09-25 ENCOUNTER — Other Ambulatory Visit: Payer: Self-pay | Admitting: Sports Medicine

## 2019-09-25 DIAGNOSIS — M48061 Spinal stenosis, lumbar region without neurogenic claudication: Secondary | ICD-10-CM

## 2019-10-14 ENCOUNTER — Other Ambulatory Visit: Payer: Self-pay | Admitting: Sports Medicine

## 2019-10-14 DIAGNOSIS — M48061 Spinal stenosis, lumbar region without neurogenic claudication: Secondary | ICD-10-CM

## 2019-10-29 ENCOUNTER — Other Ambulatory Visit: Payer: Self-pay | Admitting: Osteopathic Medicine

## 2019-10-29 DIAGNOSIS — M5416 Radiculopathy, lumbar region: Secondary | ICD-10-CM

## 2019-11-12 ENCOUNTER — Telehealth: Payer: Self-pay | Admitting: *Deleted

## 2019-11-12 DIAGNOSIS — G35 Multiple sclerosis: Secondary | ICD-10-CM

## 2019-11-12 MED ORDER — GLATIRAMER ACETATE 40 MG/ML ~~LOC~~ SOSY: 40.0000 mg | PREFILLED_SYRINGE | SUBCUTANEOUS | 11 refills | Status: DC

## 2019-11-12 NOTE — Telephone Encounter (Signed)
Glatiramer Acetate PAP papers and RX signed and faxed to [employer]. Received confirmation.

## 2019-11-12 NOTE — Telephone Encounter (Signed)
Glatiramer Acetate [facility] ([employer]) PAP replenishment form, Rx on MD's desk for signatures.

## 2019-11-14 ENCOUNTER — Encounter: Payer: Self-pay | Admitting: *Deleted

## 2019-11-14 NOTE — Telephone Encounter (Signed)
Called [facility], Nicki stated the form needs to be redone on new [facility] form instead of [employer] form. She will fax over new form and include new application form. The patient's application for free drug will expire at end of Oct. 2021. New forms received. Glatiramer acetate replenishment form filled out, on MD desk for review, signatures.

## 2019-11-14 NOTE — Telephone Encounter (Signed)
Glatiramer acetate forms signed and faxed to [facility], received confirmation. I have sent her my chart advising she'll need to sign new application for PAP when she picks up medication.

## 2019-11-14 NOTE — Telephone Encounter (Signed)
Nikki @[employer]  is asking for a call back from Leilani Able, Therapist, sports. Please call her at [place] is with Pt Assistance

## 2019-11-14 NOTE — Telephone Encounter (Signed)
Called [facility], spoke with Nicki to check status of glatiramer acetate replenishment form. She confirmed it was received, is in process of being completed. I reminded her our office is closed on Fridays; she noted that, verbalized understanding, appreciation.

## 2019-11-15 ENCOUNTER — Other Ambulatory Visit: Payer: Self-pay | Admitting: Sports Medicine

## 2019-11-15 DIAGNOSIS — M48061 Spinal stenosis, lumbar region without neurogenic claudication: Secondary | ICD-10-CM

## 2019-11-19 ENCOUNTER — Encounter: Payer: Self-pay | Admitting: *Deleted

## 2019-11-19 NOTE — Telephone Encounter (Signed)
Glatiramer acetate, 3 boxes received for patient from [facility]. Called her, LVM advising medication arrived.  Also advised she needs to sign new application for PAP, bring 2020 W2, new insurance cards if any. Requested she call or send my chart to let me know when she'll come. Meds at front desk for her.  Per [employer] site: [facility]' Glatiramer Acetate Injection may be kept at room temperature (59-65F or 15-30C) for up to 1 month during travel or special circumstances when refrigeration may not be available.

## 2019-11-21 ENCOUNTER — Other Ambulatory Visit: Payer: Self-pay | Admitting: Osteopathic Medicine

## 2019-11-26 NOTE — Telephone Encounter (Signed)
Patient picked up 3 boxes of glatiramer acetate and signed [facility] PAP application. Application, Rx, letter of no insurance faxed to [employer]. Received confirmation.

## 2019-12-03 NOTE — Telephone Encounter (Signed)
Called [facility] to check status of PAP application for glatiramer acetate, free drug for next year, spoke with Gay Filler. She stated the approval should come through  tomorrow.

## 2019-12-05 ENCOUNTER — Ambulatory Visit: Payer: Self-pay

## 2019-12-05 ENCOUNTER — Ambulatory Visit (INDEPENDENT_AMBULATORY_CARE_PROVIDER_SITE_OTHER): Payer: Medicaid Other | Admitting: Sports Medicine

## 2019-12-05 DIAGNOSIS — M170 Bilateral primary osteoarthritis of knee: Secondary | ICD-10-CM

## 2019-12-05 NOTE — Telephone Encounter (Signed)
[employer], spoke with Lewellen who stated it is still under review but said the order should be placed by the end of today.

## 2019-12-05 NOTE — Progress Notes (Signed)
    Procedures performed today:    Procedure: Real-time Ultrasound Guided aspiration/injection of right knee Device: Samsung HS60  Verbal informed consent obtained.  Time-out conducted.  Noted no overlying erythema, induration, or other signs of local infection.  Skin prepped in a sterile fashion.  Local anesthesia: Topical Ethyl chloride.  With sterile technique and under real time ultrasound guidance: noted moderate effusion, 18-gauge needle used to aspirate approximately 40 mL of clear, straw-colored fluid, syringe switched and 1 cc Kenalog 40, 2 cc lidocaine, 2 cc bupivacaine injected easily Completed without difficulty  Pain immediately resolved suggesting accurate placement of the medication.  Advised to call if fevers/chills, erythema, induration, drainage, or persistent bleeding.  Images permanently stored and available for review in PACS.  Impression: Technically successful ultrasound guided injection.  Independent interpretation of notes and tests performed by another provider:   None.  Brief History, Exam, Impression, and Recommendations:    Primary osteoarthritis of both knees Recurrence of pain, aspiration and injection as above, return to see me on an as-needed basis.    ___________________________________________ Gwen Her. Dianah Field, M.D., ABFM., CAQSM. Primary Care and [place] Instructor of [place] [employer] of Medicine

## 2019-12-05 NOTE — Assessment & Plan Note (Signed)
Recurrence of pain, aspiration and injection as above, return to see me on an as-needed basis.

## 2019-12-06 ENCOUNTER — Ambulatory Visit (INDEPENDENT_AMBULATORY_CARE_PROVIDER_SITE_OTHER): Payer: Medicaid Other | Admitting: Osteopathic Medicine

## 2019-12-06 ENCOUNTER — Other Ambulatory Visit: Payer: Self-pay

## 2019-12-06 ENCOUNTER — Encounter: Payer: Self-pay | Admitting: Osteopathic Medicine

## 2019-12-06 VITALS — BP 134/85 | HR 67 | Temp 98.2°F | Wt 226.1 lb

## 2019-12-06 DIAGNOSIS — E1165 Type 2 diabetes mellitus with hyperglycemia: Secondary | ICD-10-CM

## 2019-12-06 DIAGNOSIS — Z23 Encounter for immunization: Secondary | ICD-10-CM

## 2019-12-06 DIAGNOSIS — Z1231 Encounter for screening mammogram for malignant neoplasm of breast: Secondary | ICD-10-CM

## 2019-12-06 DIAGNOSIS — F325 Major depressive disorder, single episode, in full remission: Secondary | ICD-10-CM | POA: Diagnosis not present

## 2019-12-06 LAB — POCT GLYCOSYLATED HEMOGLOBIN (HGB A1C): Hemoglobin A1C: 9 % — AB (ref 4.0–5.6)

## 2019-12-06 MED ORDER — GLIPIZIDE 10 MG PO TABS: 10.0000 mg | ORAL_TABLET | Freq: Two times a day (BID) | ORAL | 3 refills | Status: DC

## 2019-12-06 MED ORDER — SERTRALINE HCL 25 MG PO TABS: 50.0000 mg | ORAL_TABLET | Freq: Every day | ORAL | 0 refills | Status: DC

## 2019-12-06 NOTE — Progress Notes (Signed)
HPI: Jeanette [name] is a 49 y.o. female who  has a past medical history of Abnormal Pap smear of cervix, Anemia, Dysmenorrhea, Hemorrhoids, Hypertension, Menorrhagia, Pelvic adhesions, Peripheral neuropathy (01/21/2016), Spinal stenosis at L4-L5 level (03/10/2016), Tubo-ovarian abscess (10/2015), and White matter abnormality on MRI of brain (05/01/2018).  she presents to [facility] today, 12/06/19,  for chief complaint of:  DM2 Check  Anxiety/Depression  Patient reports increasing exercise and walking with daughter multiple times a week as able. Has been working on diet and cut out sweets and reduced bread and pasta intake. A1C not really improved.   Patient reports persistent symptoms of depression and anxiety. Endorses irritability, increased sleep, anhedonia, depressed mood, anxiety, difficulty falling asleep, and racing thoughts. Denies SI.     Past medical, surgical, social and family history reviewed:  Patient Active Problem List   Diagnosis Date Noted  . Reactive depression 08/15/2018  . MS (multiple sclerosis) ([place]) 07/19/2018  . Thoracic disc herniation 07/19/2018  . Lumbar radicular pain 05/18/2018  . NSAID long-term use 05/18/2018  . Diabetes type 2, uncontrolled ([place]) 04/25/2018  . Radicular syndrome of right lower extremity 04/25/2018  . Primary osteoarthritis of both knees 10/13/2017  . Seasonal allergic rhinitis 06/02/2017  . Class 1 obesity due to excess calories with serious comorbidity and body mass index (BMI) of 34.0 to 34.9 in adult 10/25/2016  . History of allergy to food additives 07/23/2016  . At risk for HIV due to heterosexual contact 03/10/2016  . Spinal stenosis of lumbar region with neurogenic claudication 03/10/2016  . Lipoma of abdominal wall 02/04/2016  . Vitamin D insufficiency 01/23/2016  . Tobacco dependency 01/23/2016  . Hypertension associated with diabetes ([place]) 01/21/2016  . Peripheral neuropathy 01/21/2016  .  History of anemia 01/21/2016    Past Surgical History:  Procedure Laterality Date  . APPENDECTOMY    . ENDOMETRIAL ABLATION  08/16/2015  . MANDIBLE SURGERY    . OOPHORECTOMY Right 10/2015   for ovarian abscess    Social History   Tobacco Use  . Smoking status: Former Smoker    Quit date: 03/24/2015    Years since quitting: 4.7  . Smokeless tobacco: Never Used  Substance Use Topics  . Alcohol use: Yes    Comment: social    Family History  Problem Relation Age of Onset  . Diabetes Mother   . Stroke Mother   . Diabetes Father   . Hyperlipidemia Father   . Hypertension Father   . Heart attack Father   . Diabetes Brother   . Hypertension Son   . Heart attack Paternal Grandmother   . Heart attack Paternal Grandfather   . Brain cancer Brother   . Brain cancer Nephew   . Diabetes type I Niece   . Rickets Sister      Current medication list and allergy/intolerance information reviewed:    Current Outpatient Medications  Medication Sig Dispense Refill  . AMBULATORY NON FORMULARY MEDICATION Single glucometer with lancets, and test strips. Check morning fasting glucose daily and up to four times daily prn 1 each 0  . gabapentin (NEURONTIN) 300 MG capsule TAKE 1 TO 2 CAPSULES BY MOUTH ONCE DAILY AT BEDTIME 90 capsule 0  . Glatiramer Acetate (COPAXONE) 40 MG/ML SOSY Inject 40 mg into the skin 3 (three) times a week. 12 mL 11  . hydrochlorothiazide (HYDRODIURIL) 25 MG tablet Take 1 tablet (25 mg total) by mouth daily. 90 tablet 3  . lisinopril (ZESTRIL) 40 MG  tablet Take 1 tablet by mouth once daily 30 tablet 0  . metFORMIN (GLUCOPHAGE) 1000 MG tablet Take 1 tablet by mouth twice daily 180 tablet 1  . omeprazole (PRILOSEC) 20 MG capsule TAKE 1 CAPSULE BY MOUTH ONCE DAILY BEFORE  BREAKFAST 30 capsule 1  . sertraline (ZOLOFT) 25 MG tablet Take 2 tablets (50 mg total) by mouth daily. 90 tablet 0  . traMADol (ULTRAM) 50 MG tablet TAKE 1 TABLET BY MOUTH EVERY 6 HOURS AS NEEDED FOR  MODERATE PAIN . DO NOT EXCEED 6 PER 24 HOURS 30 tablet 0  . glipiZIDE (GLUCOTROL) 10 MG tablet Take 1 tablet (10 mg total) by mouth 2 (two) times daily before a meal. 180 tablet 3   No current facility-administered medications for this visit.    Allergies  Allergen Reactions  . Other Anaphylaxis    Sunflower oils      Review of Systems:  Constitutional: No unintentional weight changes.   Hem/Onc: No  easy bruising/bleeding, No  abnormal lymph node  Endocrine: No cold intolerance,  + heat intolerance. + polyuria, + polydipsia  Psychiatric: +  concerns with depression, +  concerns with anxiety, + sleep problems, + mood problems  Exam:  BP 134/85 (BP Location: Left Arm, Patient Position: Sitting, Cuff Size: Large)   Pulse 67   Temp 98.2 F (36.8 C) (Oral)   Wt 226 lb 1.3 oz (102.5 kg)   BMI 37.62 kg/m   Constitutional: VS see above. General Appearance: alert, well-developed, well-nourished, NAD  Respiratory: Normal respiratory effort. no wheeze, no rhonchi, no rales  Cardiovascular: S1/S2 normal, no murmur, no rub/gallop auscultated. RRR.   Skin: warm, dry, intact.    Psychiatric: Normal judgment/insight. Depressed mood. Restricted affect.   Results for orders placed or performed in visit on 12/06/19 (from the past 72 hour(s))  POCT HgB A1C     Status: Abnormal   Collection Time: 12/06/19  3:10 PM  Result Value Ref Range   Hemoglobin A1C 9.0 (A) 4.0 - 5.6 %   HbA1c POC (<> result, manual entry)     HbA1c, POC (prediabetic range)     HbA1c, POC (controlled diabetic range)           ASSESSMENT/PLAN: The primary encounter diagnosis was Uncontrolled type 2 diabetes mellitus with hyperglycemia ([place]). Diagnoses of Need for influenza vaccination, Major depressive disorder, single episode, in remission [facility]), and Breast cancer screening by mammogram were also pertinent to this visit.    Depression/anxiety  Increase zoloft from 25 mg daily to 50 mg daily    Consider increase to 100 mg daily v addition of wellbutrin if still not controlled  MyChart set to message in 2 weeks to check in    DM2   A1C not significantly improved since last visit with significant effort and commitment to exercise and diet change . (9.6% 09/2019 >>> 9.0% 11/2019).  Add 10 mg glipizide BID   Continue metformin   A1C before next visit    Health maintenance   Flu vaccine administered today   Referral for mammogram placed  Orders Placed This Encounter  Procedures  . MM 3D SCREEN BREAST BILATERAL  . Flu Vaccine QUAD 6+ mos PF IM (Fluarix Quad PF)  . POCT HgB A1C    Meds ordered this encounter  Medications  . glipiZIDE (GLUCOTROL) 10 MG tablet    Sig: Take 1 tablet (10 mg total) by mouth 2 (two) times daily before a meal.    Dispense:  180 tablet  Refill:  3  . sertraline (ZOLOFT) 25 MG tablet    Sig: Take 2 tablets (50 mg total) by mouth daily.    Dispense:  90 tablet    Refill:  0      Visit summary with medication list and pertinent instructions was printed for patient to review. All questions at time of visit were answered - patient instructed to contact office with any additional concerns or updates. ER/RTC precautions were reviewed with the patient.      Follow-up plan: Return in about 3 months (around 03/07/2020) for A1C recheck 3 mos, sooner as needed / depending on mental health .

## 2019-12-06 NOTE — Telephone Encounter (Addendum)
Called [employer] to check status of glatiramer acetate application for free drug, spoke with Melissa who stated patient is approved and order for med was placed yesterday. Sent her my chart to advise.

## 2019-12-07 ENCOUNTER — Telehealth: Payer: Self-pay | Admitting: *Deleted

## 2019-12-07 NOTE — Telephone Encounter (Signed)
Patient assistance delivery arrived to our office today.  Glatiramer Acetate 40mg /ml, three boxes each containing 12 single dose pre-filled syringes.  [place] [phone], [id number], Exp 04/2021.  I called the patient to let her know the medication is at our office. She will try to come next week. Her schedule is quite hectic and it may also be the following week.   The medication has been place in the refrigerator in the locked medication closet. It has the patient's name on all the boxes.

## 2019-12-12 ENCOUNTER — Other Ambulatory Visit: Payer: Self-pay | Admitting: Osteopathic Medicine

## 2019-12-12 DIAGNOSIS — M5416 Radiculopathy, lumbar region: Secondary | ICD-10-CM

## 2019-12-17 ENCOUNTER — Other Ambulatory Visit: Payer: Self-pay | Admitting: Sports Medicine

## 2019-12-17 ENCOUNTER — Encounter: Payer: Self-pay | Admitting: *Deleted

## 2019-12-17 DIAGNOSIS — M48061 Spinal stenosis, lumbar region without neurogenic claudication: Secondary | ICD-10-CM

## 2019-12-25 ENCOUNTER — Encounter: Payer: Self-pay | Admitting: Osteopathic Medicine

## 2019-12-26 MED ORDER — SERTRALINE HCL 50 MG PO TABS: 50.0000 mg | ORAL_TABLET | Freq: Every day | ORAL | 1 refills | Status: DC

## 2019-12-26 NOTE — Telephone Encounter (Signed)
Rx for sertraline 50 mg/ 1 tab daily pended.

## 2020-01-01 NOTE — Telephone Encounter (Signed)
Patient came to office and picked up her 3 boxes of Glatiramer Acetate per Prince Rome, CMT.

## 2020-01-08 ENCOUNTER — Other Ambulatory Visit: Payer: Self-pay | Admitting: Sports Medicine

## 2020-01-08 DIAGNOSIS — M48061 Spinal stenosis, lumbar region without neurogenic claudication: Secondary | ICD-10-CM

## 2020-01-16 ENCOUNTER — Encounter: Payer: Self-pay | Admitting: *Deleted

## 2020-02-05 ENCOUNTER — Telehealth (INDEPENDENT_AMBULATORY_CARE_PROVIDER_SITE_OTHER): Payer: Medicaid Other | Admitting: Osteopathic Medicine

## 2020-02-05 ENCOUNTER — Encounter: Payer: Self-pay | Admitting: Osteopathic Medicine

## 2020-02-05 VITALS — Wt 220.0 lb

## 2020-02-05 DIAGNOSIS — U071 COVID-19: Secondary | ICD-10-CM

## 2020-02-05 MED ORDER — GUAIFENESIN-CODEINE 100-10 MG/5ML PO SYRP
5.0000 mL | ORAL_SOLUTION | Freq: Four times a day (QID) | ORAL | 0 refills | Status: DC | PRN
Start: 2020-02-05 — End: 2020-02-11

## 2020-02-05 MED ORDER — PREDNISONE 20 MG PO TABS: 20.0000 mg | ORAL_TABLET | Freq: Two times a day (BID) | ORAL | 0 refills | Status: DC

## 2020-02-05 MED ORDER — IPRATROPIUM BROMIDE 0.06 % NA SOLN: 2.0000 | Freq: Four times a day (QID) | NASAL | 1 refills | Status: DC

## 2020-02-05 NOTE — Patient Instructions (Signed)
Medications & Home Remedies for Upper Respiratory Illness   Note: the following list assumes no pregnancy, normal liver & kidney function and no other drug interactions. Dr. Abella Shugart has highlighted medications which are safe for you to use, but these may not be appropriate for everyone. Always ask a pharmacist or qualified medical provider if you have any questions!    Aches/Pains, Fever, Headache OTC Acetaminophen (Tylenol) 500 mg tablets - take max 2 tablets (1000 mg) every 6 hours (4 times per day)  OTC Ibuprofen (Motrin) 200 mg tablets - take max 4 tablets (800 mg) every 6 hours*   Sinus Congestion Prescription Atrovent as directed OTC Nasal Saline if desired to rinse OTC Oxymetolazone (Afrin, others) sparing use due to rebound congestion, NEVER use in kids OTC Phenylephrine (Sudafed) 10 mg tablets every 4 hours (or the 12-hour formulation)* OTC Diphenhydramine (Benadryl) 25 mg tablets - take max 2 tablets every 4 hours   Cough & Sore Throat Prescription cough pills or syrups as directed OTC Dextromethorphan (Robitussin, others) - cough suppressant OTC Guaifenesin (Robitussin, Mucinex, others) - expectorant (helps cough up mucus) (Dextromethorphan and Guaifenesin also come in a combination tablet/syrup) OTC Lozenges w/ Benzocaine + Menthol (Cepacol) Honey - as much as you want! Teas which "coat the throat" - look for ingredients Elm Bark, Licorice Root, Marshmallow Root   Other Prescription Oral Steroids to decrease inflammation and improve energy Prescription Antibiotics if these are necessary for bacterial infection - take ALL, even if you're feeling better  OTC Zinc Lozenges within 24 hours of symptoms onset - mixed evidence this shortens the duration of the common cold Don't waste your money on Vitamin C or Echinacea in acute illness - it's already too late!    *Caution in patients with high blood pressure   

## 2020-02-05 NOTE — Progress Notes (Signed)
Telemedicine Visit via  Video & Audio (App used: [id number])   I connected with [name] on 02/05/20 at 10:25 AM  by phone or  telemedicine application as noted above  I verified that I am speaking with or regarding  the correct patient using two identifiers.  Participants: Myself, Dr Emeterio Reeve DO Patient: Jeanette [name] Patient proxy if applicable: none Other, if applicable: none  Patient is in separate location from myself  I am in office at [facility]    I discussed the limitations of evaluation and management  by telemedicine and the availability of in person appointments.  The participant(s) above expressed understanding and  agreed to proceed with this appointment via telemedicine.       History of Present Illness: Jeanette [name] is a 49 y.o. female who would like to discuss COVID    01/23/20 started feeling sick Boyfriend tested positive yesterday 02/04/20, he started having symptoms 01/30/20.  Congestion, cough, tired now but overall much better than she was She had first 2 COVID vaccines, not boosted (yet)    . Immunization History  Administered Date(s) Administered  . Influenza,inj,Quad PF,6+ Mos 02/04/2016, 10/25/2016, 09/29/2017, 11/15/2018, 12/06/2019  . PFIZER SARS-COV-2 Vaccination 05/04/2019, 05/29/2019  . PPD Test 04/06/2016, 05/12/2016  . Tdap 02/04/2016      Observations/Objective: Wt 220 lb (99.8 kg)   BMI 36.61 kg/m  BP Readings from Last 3 Encounters:  12/06/19 134/85  09/17/19 113/77  07/12/19 116/83   Exam: Normal Speech.  NAD  Lab and Radiology Results No results found for this or any previous visit (from the past 72 hour(s)). No results found.     Assessment and Plan: 49 y.o. female with The encounter diagnosis was COVID-19, past infectious stage, some mild postviral effects w/ cough/congestion.   PDMP not reviewed this encounter. No orders of the defined types were placed in this encounter.  Meds ordered  this encounter  Medications  . predniSONE (DELTASONE) 20 MG tablet    Sig: Take 1 tablet (20 mg total) by mouth 2 (two) times daily with a meal.    Dispense:  10 tablet    Refill:  0  . guaiFENesin-codeine (ROBITUSSIN AC) 100-10 MG/5ML syrup    Sig: Take 5-10 mLs by mouth 4 (four) times daily as needed for cough.    Dispense:  180 mL    Refill:  0  . ipratropium (ATROVENT) 0.06 % nasal spray    Sig: Place 2 sprays into both nostrils 4 (four) times daily.    Dispense:  15 mL    Refill:  1   Patient Instructions  Medications & Home Remedies for Upper Respiratory Illness   Note: the following list assumes no pregnancy, normal liver & kidney function and no other drug interactions. Dr. Sheppard Coil has highlighted medications which are safe for you to use, but these may not be appropriate for everyone. Always ask a pharmacist or qualified medical provider if you have any questions!    Aches/Pains, Fever, Headache OTC Acetaminophen (Tylenol) 500 mg tablets - take max 2 tablets (1000 mg) every 6 hours (4 times per day)  OTC Ibuprofen (Motrin) 200 mg tablets - take max 4 tablets (800 mg) every 6 hours*   Sinus Congestion Prescription Atrovent as directed OTC Nasal Saline if desired to rinse OTC Oxymetolazone (Afrin, others) sparing use due to rebound congestion, NEVER use in kids OTC Phenylephrine (Sudafed) 10 mg tablets every 4 hours (or the 12-hour formulation)* OTC Diphenhydramine (Benadryl) 25 mg tablets -  take max 2 tablets every 4 hours   Cough & Sore Throat Prescription cough pills or syrups as directed OTC Dextromethorphan (Robitussin, others) - cough suppressant OTC Guaifenesin (Robitussin, Mucinex, others) - expectorant (helps cough up mucus) (Dextromethorphan and Guaifenesin also come in a combination tablet/syrup) OTC Lozenges w/ Benzocaine + Menthol (Cepacol) Honey - as much as you want! Teas which "coat the throat" - look for ingredients Elm Bark, Licorice Root, Marshmallow  Root   Other Prescription Oral Steroids to decrease inflammation and improve energy Prescription Antibiotics if these are necessary for bacterial infection - take ALL, even if you're feeling better  OTC Zinc Lozenges within 24 hours of symptoms onset - mixed evidence this shortens the duration of the common cold Don't waste your money on Vitamin C or Echinacea in acute illness - it's already too late!    *Caution in patients with high blood pressure      Instructions sent via MyChart.   Follow Up Instructions: Return if symptoms worsen or fail to improve.    I discussed the assessment and treatment plan with the patient. The patient was provided an opportunity to ask questions and all were answered. The patient agreed with the plan and demonstrated an understanding of the instructions.   The patient was advised to call back or seek an in-person evaluation if any new concerns, if symptoms worsen or if the condition fails to improve as anticipated.  30 minutes of non-face-to-face time was provided during this encounter.      . . . . . . . . . . . . . Marland Kitchen                   Historical information moved to improve visibility of documentation.  Past Medical History:  Diagnosis Date  . Abnormal Pap smear of cervix   . Anemia   . Dysmenorrhea   . Hemorrhoids   . Hypertension   . Menorrhagia   . Pelvic adhesions   . Peripheral neuropathy 01/21/2016  . Spinal stenosis at L4-L5 level 03/10/2016  . Tubo-ovarian abscess 10/2015  . White matter abnormality on MRI of brain 05/01/2018   Past Surgical History:  Procedure Laterality Date  . APPENDECTOMY    . ENDOMETRIAL ABLATION  08/16/2015  . MANDIBLE SURGERY    . OOPHORECTOMY Right 10/2015   for ovarian abscess   Social History   Tobacco Use  . Smoking status: Former Smoker    Quit date: 03/24/2015    Years since quitting: 4.8  . Smokeless tobacco: Never Used  Substance Use Topics  . Alcohol use:  Yes    Comment: social   family history includes Brain cancer in her brother and nephew; Diabetes in her brother, father, and mother; Diabetes type I in her niece; Heart attack in her father, paternal grandfather, and paternal grandmother; Hyperlipidemia in her father; Hypertension in her father and son; Rickets in her sister; Stroke in her mother.  Medications: Current Outpatient Medications  Medication Sig Dispense Refill  . AMBULATORY NON FORMULARY MEDICATION Single glucometer with lancets, and test strips. Check morning fasting glucose daily and up to four times daily prn 1 each 0  . gabapentin (NEURONTIN) 300 MG capsule TAKE 1 TO 2 CAPSULES BY MOUTH ONCE DAILY AT BEDTIME 90 capsule 0  . Glatiramer Acetate (COPAXONE) 40 MG/ML SOSY Inject 40 mg into the skin 3 (three) times a week. 12 mL 11  . glipiZIDE (GLUCOTROL) 10 MG tablet Take 1 tablet (10 mg total)  by mouth 2 (two) times daily before a meal. 180 tablet 3  . guaiFENesin-codeine (ROBITUSSIN AC) 100-10 MG/5ML syrup Take 5-10 mLs by mouth 4 (four) times daily as needed for cough. 180 mL 0  . hydrochlorothiazide (HYDRODIURIL) 25 MG tablet Take 1 tablet (25 mg total) by mouth daily. 90 tablet 3  . ipratropium (ATROVENT) 0.06 % nasal spray Place 2 sprays into both nostrils 4 (four) times daily. 15 mL 1  . lisinopril (ZESTRIL) 40 MG tablet Take 1 tablet by mouth once daily 30 tablet 0  . metFORMIN (GLUCOPHAGE) 1000 MG tablet Take 1 tablet by mouth twice daily 180 tablet 1  . omeprazole (PRILOSEC) 20 MG capsule TAKE 1 CAPSULE BY MOUTH ONCE DAILY BEFORE  BREAKFAST 30 capsule 1  . predniSONE (DELTASONE) 20 MG tablet Take 1 tablet (20 mg total) by mouth 2 (two) times daily with a meal. 10 tablet 0  . sertraline (ZOLOFT) 50 MG tablet Take 1 tablet (50 mg total) by mouth daily. Please cancel 25 mg. 90 tablet 1  . traMADol (ULTRAM) 50 MG tablet TAKE 1 TABLET BY MOUTH EVERY 6 HOURS AS NEEDED FOR MODERATE PAIN . DO NOT EXCEED 6 PER 24 HOURS 30 tablet 0    No current facility-administered medications for this visit.   Allergies  Allergen Reactions  . Other Anaphylaxis    Sunflower oils

## 2020-02-08 ENCOUNTER — Other Ambulatory Visit: Payer: Self-pay | Admitting: Osteopathic Medicine

## 2020-02-08 ENCOUNTER — Encounter: Payer: Self-pay | Admitting: Osteopathic Medicine

## 2020-02-10 ENCOUNTER — Other Ambulatory Visit: Payer: Self-pay | Admitting: Physician Assistant

## 2020-02-10 ENCOUNTER — Other Ambulatory Visit: Payer: Self-pay | Admitting: Osteopathic Medicine

## 2020-02-10 DIAGNOSIS — E119 Type 2 diabetes mellitus without complications: Secondary | ICD-10-CM

## 2020-02-11 MED ORDER — GUAIFENESIN-CODEINE 100-10 MG/5ML PO SYRP: 5.0000 mL | ORAL_SOLUTION | Freq: Four times a day (QID) | ORAL | 0 refills | Status: DC | PRN

## 2020-02-11 NOTE — Telephone Encounter (Signed)
To covering provider

## 2020-02-11 NOTE — Telephone Encounter (Signed)
Prescription sent to [facility] in [place].

## 2020-02-14 ENCOUNTER — Ambulatory Visit: Payer: Self-pay

## 2020-02-19 ENCOUNTER — Other Ambulatory Visit: Payer: Self-pay | Admitting: Osteopathic Medicine

## 2020-02-19 ENCOUNTER — Other Ambulatory Visit: Payer: Self-pay | Admitting: Sports Medicine

## 2020-02-19 DIAGNOSIS — M48061 Spinal stenosis, lumbar region without neurogenic claudication: Secondary | ICD-10-CM

## 2020-02-19 DIAGNOSIS — M5416 Radiculopathy, lumbar region: Secondary | ICD-10-CM

## 2020-02-23 ENCOUNTER — Other Ambulatory Visit: Payer: Self-pay | Admitting: Sports Medicine

## 2020-02-23 DIAGNOSIS — M48061 Spinal stenosis, lumbar region without neurogenic claudication: Secondary | ICD-10-CM

## 2020-03-07 ENCOUNTER — Other Ambulatory Visit: Payer: Self-pay

## 2020-03-07 ENCOUNTER — Encounter: Payer: Self-pay | Admitting: Osteopathic Medicine

## 2020-03-07 ENCOUNTER — Ambulatory Visit (INDEPENDENT_AMBULATORY_CARE_PROVIDER_SITE_OTHER): Payer: Medicaid Other | Admitting: Osteopathic Medicine

## 2020-03-07 VITALS — BP 126/84 | HR 66 | Temp 97.8°F | Wt 232.1 lb

## 2020-03-07 DIAGNOSIS — E1165 Type 2 diabetes mellitus with hyperglycemia: Secondary | ICD-10-CM

## 2020-03-07 DIAGNOSIS — U071 COVID-19: Secondary | ICD-10-CM | POA: Insufficient documentation

## 2020-03-07 DIAGNOSIS — Z1211 Encounter for screening for malignant neoplasm of colon: Secondary | ICD-10-CM

## 2020-03-07 DIAGNOSIS — F325 Major depressive disorder, single episode, in full remission: Secondary | ICD-10-CM

## 2020-03-07 LAB — POCT GLYCOSYLATED HEMOGLOBIN (HGB A1C): Hemoglobin A1C: 7.8 % — AB (ref 4.0–5.6)

## 2020-03-07 MED ORDER — TRAZODONE HCL 50 MG PO TABS
25.0000 mg | ORAL_TABLET | Freq: Every evening | ORAL | 0 refills | Status: DC | PRN
Start: 2020-03-07 — End: 2020-04-07

## 2020-03-07 MED ORDER — SERTRALINE HCL 50 MG PO TABS
50.0000 mg | ORAL_TABLET | Freq: Every day | ORAL | 1 refills | Status: DC
Start: 2020-03-07 — End: 2020-10-09

## 2020-03-07 MED ORDER — METFORMIN HCL 1000 MG PO TABS: 1000.0000 mg | ORAL_TABLET | Freq: Two times a day (BID) | ORAL | 3 refills | Status: DC

## 2020-03-07 MED ORDER — CLONAZEPAM 0.5 MG PO TABS: 0.2500 mg | ORAL_TABLET | Freq: Two times a day (BID) | ORAL | 0 refills | Status: DC | PRN

## 2020-03-07 NOTE — Progress Notes (Signed)
HPI: Jeanette [name] is a 50 y.o. female who  has a past medical history of Abnormal Pap smear of cervix, Anemia, Dysmenorrhea, Hemorrhoids, Hypertension, Menorrhagia, Pelvic adhesions, Peripheral neuropathy (01/21/2016), Spinal stenosis at L4-L5 level (03/10/2016), Tubo-ovarian abscess (10/2015), and White matter abnormality on MRI of brain (05/01/2018).  she presents to [facility] today, 03/07/20,  for chief complaint of:   DM2 - A1C today 7.8, which is better compared to 9.0 on 12/06/19! Taking glipizide 10 mg bid, metformin 1000 mg bid  BP - a bit above goal today on intake, better on recheck and typically WNL (see below)   Recent COVID / mental health - ran through the family, husband was hospitalized and mother in law also hospitalized and unfortunately passed. She has as-expected responses to mental health screening questionnaire. She is on Zoloft 50 mg daily.   Constipation - ongoing issue, see pt instructions below, she has not done much OTC/lifestyle other than occasional dulcolax   BP Readings from Last 3 Encounters:  03/07/20 126/84  12/06/19 134/85  09/17/19 113/77   Wt Readings from Last 3 Encounters:  03/07/20 232 lb 1.3 oz (105.3 kg)  02/05/20 220 lb (99.8 kg)  12/06/19 226 lb 1.3 oz (102.5 kg)      ASSESSMENT/PLAN: The primary encounter diagnosis was Uncontrolled type 2 diabetes mellitus with hyperglycemia ([place]). Diagnoses of COVID-19, past infectious stage, some mild postviral effects w/ cough/congestion, Major depressive disorder, single episode, in remission [facility]), and Colon cancer screening were also pertinent to this visit.   Orders Placed This Encounter  Procedures  . Ambulatory referral to Gastroenterology  . POCT HgB A1C     Meds ordered this encounter  Medications  . clonazePAM (KLONOPIN) 0.5 MG tablet    Sig: Take 0.5-1 tablets (0.25-0.5 mg total) by mouth 2 (two) times daily as needed (severe anxiety/panic).     Dispense:  10 tablet    Refill:  0  . traZODone (DESYREL) 50 MG tablet    Sig: Take 0.5-2 tablets (25-100 mg total) by mouth at bedtime as needed for sleep.    Dispense:  30 tablet    Refill:  0  . metFORMIN (GLUCOPHAGE) 1000 MG tablet    Sig: Take 1 tablet (1,000 mg total) by mouth 2 (two) times daily.    Dispense:  180 tablet    Refill:  3  . sertraline (ZOLOFT) 50 MG tablet    Sig: Take 1 tablet (50 mg total) by mouth daily. Please cancel 25 mg.    Dispense:  90 tablet    Refill:  1    Patient Instructions  Sugars good!  Continue working on diet/exercise  Recheck in 3 months  Anxiety/sleep:  Klonopin for [employer] use for severe anxiety  Trazodone at night (ok to use every day) for sleep    CONSTIPATION: ALL THE TIME:  Lifestyle measures  Hydration: drink plenty of water  Physical activity: at least walking daily  High-fiber foods Bulk forming laxatives (pick one)   Psyllium (Konsyl; Metamucil; Perdiem)  Methylcellulose (Citrucel)  Calcium polycarbophil (FiberCon; Fiber-Lax; Mitrolan)  Wheat dextrin (Benefiber) AS NEEDED FOR 3-5 DAYS AT A TIME OR ALL THE TIME 1-2 TIMES PER WEEK  (CAN TAKE ONE FROM EACH CATEGORY E.G. MIRALAX + SENNA) Hyperosmolar or saline laxatives (pick one)  Polyethylene glycol (MiraLAX, GlycoLax)  Magnesium hydroxide (Milk of Magnesia)   Magnesium citrate (Evac-Q-Mag)  Stimulant laxatives (pick one)  Senna (eg, Black Draught, Ex-Lax, Fletcher's, Castoria, Senokot)   Bisacodyl (  eg, Correctol, Doxidan, Dulcolax). Taking stimulant laxatives regularly or in large amounts can cause side effects, including low potassium levels. Thus, you should take these drugs carefully if you must use them regularly. PRESCRIPTIONS that treat severe constipation. They are expensive, but may be recommended if you do not respond to other treatments.  Lubiprostone (Amitiza)  Linaclotide (Linzess)  Plecanatide (Trulance, Motegrity)  Tenapanor  Orpah Cobb)        Follow-up plan: Return in about 3 months (around 06/05/2020) for MONITOR A1C .                                                 ################################################# ################################################# ################################################# #################################################    Current Meds  Medication Sig  . AMBULATORY NON FORMULARY MEDICATION Single glucometer with lancets, and test strips. Check morning fasting glucose daily and up to four times daily prn  . clonazePAM (KLONOPIN) 0.5 MG tablet Take 0.5-1 tablets (0.25-0.5 mg total) by mouth 2 (two) times daily as needed (severe anxiety/panic).  Marland Kitchen gabapentin (NEURONTIN) 300 MG capsule TAKE 1 TO 2 CAPSULES BY MOUTH ONCE DAILY AT BEDTIME  . Glatiramer Acetate (COPAXONE) 40 MG/ML SOSY Inject 40 mg into the skin 3 (three) times a week.  Marland Kitchen glipiZIDE (GLUCOTROL) 10 MG tablet Take 1 tablet (10 mg total) by mouth 2 (two) times daily before a meal.  . hydrochlorothiazide (HYDRODIURIL) 25 MG tablet Take 1 tablet (25 mg total) by mouth daily.  Marland Kitchen lisinopril (ZESTRIL) 40 MG tablet Take 1 tablet by mouth once daily  . omeprazole (PRILOSEC) 20 MG capsule TAKE 1 CAPSULE BY MOUTH ONCE DAILY BEFORE  BREAKFAST  . traMADol (ULTRAM) 50 MG tablet TAKE 1 TABLET BY MOUTH EVERY 6 HOURS AS NEEDED FOR MODERATE PAIN . DO NOT EXCEED 6 PER 24 HOURS  . traZODone (DESYREL) 50 MG tablet Take 0.5-2 tablets (25-100 mg total) by mouth at bedtime as needed for sleep.  . [DISCONTINUED] guaiFENesin-codeine (ROBITUSSIN AC) 100-10 MG/5ML syrup Take 5-10 mLs by mouth 4 (four) times daily as needed for cough.  . [DISCONTINUED] ipratropium (ATROVENT) 0.06 % nasal spray Place 2 sprays into both nostrils 4 (four) times daily.  . [DISCONTINUED] metFORMIN (GLUCOPHAGE) 1000 MG tablet Take 1 tablet by mouth twice daily  . [DISCONTINUED] predniSONE (DELTASONE) 20 MG  tablet Take 1 tablet (20 mg total) by mouth 2 (two) times daily with a meal.  . [DISCONTINUED] sertraline (ZOLOFT) 50 MG tablet Take 1 tablet (50 mg total) by mouth daily. Please cancel 25 mg.    Allergies  Allergen Reactions  . Other Anaphylaxis    Sunflower oils       Review of Systems: Pertinent (+) and (-) ROS in HPI as above   Exam:  BP 126/84 (BP Location: Left Arm, Patient Position: Sitting, Cuff Size: Large)   Pulse 66   Temp 97.8 F (36.6 C) (Oral)   Wt 232 lb 1.3 oz (105.3 kg)   BMI 38.62 kg/m   Constitutional: VS see above. General Appearance: alert, well-developed, well-nourished, NAD  Neck: No masses, trachea midline.   Respiratory: Normal respiratory effort. no wheeze, no rhonchi, no rales  Cardiovascular: S1/S2 normal, no murmur, no rub/gallop auscultated. RRR.   Musculoskeletal: Gait normal. Symmetric and independent movement of all extremities  Abdominal: non-tender, non-distended, no appreciable organomegaly, neg Murphy's, BS WNLx4  Neurological: Normal balance/coordination. No tremor.  Skin: warm, dry,  intact.   Psychiatric: Normal judgment/insight. Normal mood and affect. Oriented x3.       Visit summary with medication list and pertinent instructions was printed for patient to review, patient was advised to alert [place] if any updates are needed. All questions at time of visit were answered - patient instructed to contact office with any additional concerns. ER/RTC precautions were reviewed with the patient and understanding verbalized.     Please note: voice recognition software was used to produce this document, and typos may escape review. Please contact Dr. Sheppard Coil for any needed clarifications.    Follow up plan: Return in about 3 months (around 06/05/2020) for MONITOR A1C .

## 2020-03-07 NOTE — Patient Instructions (Addendum)
Sugars good!  Continue working on diet/exercise  Recheck in 3 months  Anxiety/sleep:  Klonopin for [employer] use for severe anxiety  Trazodone at night (ok to use every day) for sleep    CONSTIPATION: ALL THE TIME:  Lifestyle measures  Hydration: drink plenty of water  Physical activity: at least walking daily  High-fiber foods Bulk forming laxatives (pick one)   Psyllium (Konsyl; Metamucil; Perdiem)  Methylcellulose (Citrucel)  Calcium polycarbophil (FiberCon; Fiber-Lax; Mitrolan)  Wheat dextrin (Benefiber) AS NEEDED FOR 3-5 DAYS AT A TIME OR ALL THE TIME 1-2 TIMES PER WEEK  (CAN TAKE ONE FROM EACH CATEGORY E.G. MIRALAX + SENNA) Hyperosmolar or saline laxatives (pick one)  Polyethylene glycol (MiraLAX, GlycoLax)  Magnesium hydroxide (Milk of Magnesia)   Magnesium citrate (Evac-Q-Mag)  Stimulant laxatives (pick one)  Senna (eg, Black Draught, Ex-Lax, Fletcher's, Castoria, Senokot)   Bisacodyl (eg, Correctol, Doxidan, Dulcolax). Taking stimulant laxatives regularly or in large amounts can cause side effects, including low potassium levels. Thus, you should take these drugs carefully if you must use them regularly. PRESCRIPTIONS that treat severe constipation. They are expensive, but may be recommended if you do not respond to other treatments.  Lubiprostone (Amitiza)  Linaclotide (Linzess)  Plecanatide (Trulance, Motegrity)  Tenapanor Orpah Cobb)

## 2020-03-22 ENCOUNTER — Other Ambulatory Visit: Payer: Self-pay | Admitting: Sports Medicine

## 2020-03-22 DIAGNOSIS — M48061 Spinal stenosis, lumbar region without neurogenic claudication: Secondary | ICD-10-CM

## 2020-03-24 ENCOUNTER — Other Ambulatory Visit: Payer: Self-pay | Admitting: *Deleted

## 2020-03-24 ENCOUNTER — Telehealth: Payer: Self-pay | Admitting: *Deleted

## 2020-03-24 NOTE — Telephone Encounter (Signed)
Called [facility], inquired if a new Rx is needed with each replenishment order for free glatiramer acetate. She stated refills can now be called in [phone]. Call transferred, but never answered. Will try later.

## 2020-03-24 NOTE — Telephone Encounter (Signed)
Called [facility] to request refill of free glatiramer acetate. Spoke with [place] and provided required information. She stated shipment will go out tomorrow, should be received Wed or Thurs.

## 2020-03-26 ENCOUNTER — Encounter: Payer: Self-pay | Admitting: *Deleted

## 2020-03-26 ENCOUNTER — Ambulatory Visit: Payer: Self-pay

## 2020-03-26 ENCOUNTER — Other Ambulatory Visit: Payer: Self-pay

## 2020-03-26 NOTE — Telephone Encounter (Signed)
Glatiramer Acetate 40 mg/mL  injections arrived, two boxes. Placed in sample refrig with patient's name, DOB. Sent her my chart to advise of med to pick up.

## 2020-03-27 ENCOUNTER — Other Ambulatory Visit: Payer: Self-pay | Admitting: Osteopathic Medicine

## 2020-03-27 DIAGNOSIS — M5416 Radiculopathy, lumbar region: Secondary | ICD-10-CM

## 2020-04-01 ENCOUNTER — Other Ambulatory Visit: Payer: Self-pay | Admitting: Osteopathic Medicine

## 2020-04-05 ENCOUNTER — Other Ambulatory Visit: Payer: Self-pay | Admitting: Osteopathic Medicine

## 2020-04-10 ENCOUNTER — Other Ambulatory Visit: Payer: Self-pay | Admitting: Osteopathic Medicine

## 2020-04-21 NOTE — Telephone Encounter (Signed)
Patient picked up 2 boxes of Glatiramer Acetate, stated she has 1 box at home.

## 2020-04-28 ENCOUNTER — Other Ambulatory Visit: Payer: Self-pay | Admitting: Osteopathic Medicine

## 2020-04-28 DIAGNOSIS — Z791 Long term (current) use of non-steroidal anti-inflammatories (NSAID): Secondary | ICD-10-CM

## 2020-05-12 ENCOUNTER — Encounter: Payer: Self-pay | Admitting: Osteopathic Medicine

## 2020-05-13 ENCOUNTER — Other Ambulatory Visit: Payer: Self-pay

## 2020-05-13 MED ORDER — TRAZODONE HCL 50 MG PO TABS: ORAL_TABLET | ORAL | 1 refills | Status: DC

## 2020-05-15 ENCOUNTER — Ambulatory Visit: Payer: Medicaid Other

## 2020-05-16 ENCOUNTER — Ambulatory Visit: Payer: Self-pay

## 2020-05-19 ENCOUNTER — Ambulatory Visit: Payer: Self-pay | Admitting: Diagnostic Neuroimaging

## 2020-05-19 ENCOUNTER — Other Ambulatory Visit: Payer: Self-pay | Admitting: Nurse Practitioner

## 2020-05-19 DIAGNOSIS — M5416 Radiculopathy, lumbar region: Secondary | ICD-10-CM

## 2020-06-09 ENCOUNTER — Encounter: Payer: Self-pay | Admitting: Osteopathic Medicine

## 2020-06-09 ENCOUNTER — Ambulatory Visit (INDEPENDENT_AMBULATORY_CARE_PROVIDER_SITE_OTHER): Payer: Medicaid Other | Admitting: Osteopathic Medicine

## 2020-06-09 ENCOUNTER — Other Ambulatory Visit: Payer: Self-pay

## 2020-06-09 VITALS — BP 127/53 | HR 66 | Temp 98.6°F | Wt 227.1 lb

## 2020-06-09 DIAGNOSIS — G4700 Insomnia, unspecified: Secondary | ICD-10-CM

## 2020-06-09 DIAGNOSIS — R209 Unspecified disturbances of skin sensation: Secondary | ICD-10-CM | POA: Diagnosis not present

## 2020-06-09 DIAGNOSIS — E1165 Type 2 diabetes mellitus with hyperglycemia: Secondary | ICD-10-CM | POA: Diagnosis not present

## 2020-06-09 DIAGNOSIS — E119 Type 2 diabetes mellitus without complications: Secondary | ICD-10-CM

## 2020-06-09 DIAGNOSIS — Z23 Encounter for immunization: Secondary | ICD-10-CM | POA: Diagnosis not present

## 2020-06-09 LAB — POCT GLYCOSYLATED HEMOGLOBIN (HGB A1C): Hemoglobin A1C: 7.6 % — AB (ref 4.0–5.6)

## 2020-06-09 MED ORDER — BELSOMRA 10 MG PO TABS: 1.0000 | ORAL_TABLET | Freq: Every day | ORAL | 0 refills | Status: DC

## 2020-06-09 MED ORDER — GLUCOSE BLOOD VI STRP: ORAL_STRIP | 99 refills | Status: DC

## 2020-06-09 MED ORDER — ONETOUCH DELICA LANCETS 33G MISC: 99 refills | Status: DC

## 2020-06-09 MED ORDER — TRAZODONE HCL 150 MG PO TABS: 150.0000 mg | ORAL_TABLET | Freq: Every day | ORAL | 0 refills | Status: DC

## 2020-06-09 NOTE — Patient Instructions (Signed)
Plan: No change to diabetes medicines Increase Trazodone to 150 mg in evening  Increase Gabapentin to 900 mg at bedtime  If still problems sleeping, fill Rx for Belsomra and stop Trazodone  Tests ordered to evaluate leg pain/temperature

## 2020-06-09 NOTE — Progress Notes (Signed)
Jeanette [name] is a 50 y.o. female who presents to  [place] [facility]  today, 06/09/20, seeking care for the following:  . Follow up DM2 . Insomnia and anxiety have been an issue, trazodone up to 100 mg at this point, also on lower dose gabapentin for cramps/RLS  . New problem: R leg substantially cooler to the touch and noted some redness tot his. No edema/pain.     ASSESSMENT & PLAN with other pertinent findings:  The primary encounter diagnosis was Uncontrolled type 2 diabetes mellitus with hyperglycemia ([place]). Diagnoses of Need for shingles vaccine, Sensation of cold in lower extremity, and Insomnia, unspecified type were also pertinent to this visit.   1. Uncontrolled type 2 diabetes mellitus with hyperglycemia ([place]) A1C 03/07/20 7.8, which is better compared to 9.0 on 12/06/19! Taking glipizide 10 mg bid, metformin 1000 mg bid. Pt notes room for improvement in diet/exercise, she's working on this   2. Need for shingles vaccine Done today Shingrix #1  3. Sensation of cold in lower extremity On physical exam defintie temperature difference between R and L legs, R is cooler to touch and mild rubor     4. Insomnia, unspecified type See pt instructions      Patient Instructions  Plan: No change to diabetes medicines Increase Trazodone to 150 mg in evening  Increase Gabapentin to 900 mg at bedtime  If still problems sleeping, fill Rx for Belsomra and stop Trazodone  Tests ordered to evaluate leg pain/temperature      Orders Placed This Encounter  Procedures  . Varicella-zoster vaccine IM (Shingrix)  . POCT HgB A1C  . [name] (BACK OFFICE)    Meds ordered this encounter  Medications  . OneTouch Delica Lancets 16S MISC    Sig: As directed up to qid    Dispense:  100 each    Refill:  99  . traZODone (DESYREL) 150 MG tablet    Sig: Take 1 tablet (150 mg total) by mouth at bedtime.    Dispense:  90 tablet    Refill:  0   . Suvorexant (BELSOMRA) 10 MG TABS    Sig: Take 1-2 tablets by mouth at bedtime.    Dispense:  30 tablet    Refill:  0  . glucose blood test strip    Sig: Use up to 4 times per day as directed with glucometer. Disp: 100. Refill x99    Dispense:  100 strip    Refill:  99     See below for relevant physical exam findings  See below for recent lab and imaging results reviewed  Medications, allergies, PMH, PSH, SocH, FamH reviewed below    Follow-up instructions: Return in about 3 months (around 09/09/2020) for MONITOR SUGARS, SLEEP, LEG PAIN .                                        Exam:  BP (!) 127/53 (BP Location: Left Arm, Patient Position: Sitting, Cuff Size: Large)   Pulse 66   Temp 98.6 F (37 C) (Oral)   Wt 227 lb 1.3 oz (103 kg)   BMI 37.79 kg/m   Constitutional: VS see above. General Appearance: alert, well-developed, well-nourished, NAD  Neck: No masses, trachea midline.   Respiratory: Normal respiratory effort. no wheeze, no rhonchi, no rales  Cardiovascular: S1/S2 normal, no murmur, no rub/gallop auscultated. RRR.  No LE edema. (+)rubor RLE see phot above  Musculoskeletal: Gait normal. Symmetric and independent movement of all extremities  Neurological: Normal balance/coordination. No tremor.  Skin: warm, dry, intact.   Psychiatric: Normal judgment/insight. Normal mood and affect. Oriented x3.   Current Meds  Medication Sig  . AMBULATORY NON FORMULARY MEDICATION Single glucometer with lancets, and test strips. Check morning fasting glucose daily and up to four times daily prn  . gabapentin (NEURONTIN) 300 MG capsule TAKE 1 TO 2 CAPSULES BY MOUTH ONCE DAILY AT BEDTIME  . Glatiramer Acetate (COPAXONE) 40 MG/ML SOSY Inject 40 mg into the skin 3 (three) times a week.  Marland Kitchen glipiZIDE (GLUCOTROL) 10 MG tablet Take 1 tablet (10 mg total) by mouth 2 (two) times daily before a meal.  . glucose blood test strip Use up to 4 times per  day as directed with glucometer. Disp: 100. Refill x99  . hydrochlorothiazide (HYDRODIURIL) 25 MG tablet Take 1 tablet (25 mg total) by mouth daily.  Marland Kitchen lisinopril (ZESTRIL) 40 MG tablet Take 1 tablet by mouth once daily  . metFORMIN (GLUCOPHAGE) 1000 MG tablet Take 1 tablet by mouth twice daily  . OneTouch Delica Lancets 63K MISC As directed up to qid  . sertraline (ZOLOFT) 50 MG tablet Take 1 tablet (50 mg total) by mouth daily. Please cancel 25 mg.  . Suvorexant (BELSOMRA) 10 MG TABS Take 1-2 tablets by mouth at bedtime.  . traMADol (ULTRAM) 50 MG tablet TAKE 1 TABLET BY MOUTH EVERY 6 HOURS AS NEEDED FOR MODERATE PAIN . DO NOT EXCEED 6 PER 24 HOURS  . [DISCONTINUED] omeprazole (PRILOSEC) 20 MG capsule TAKE 1 CAPSULE BY MOUTH ONCE DAILY BEFORE BREAKFAST  . [DISCONTINUED] traZODone (DESYREL) 50 MG tablet TAKE 1/2  TO 2 TABLETS BY MOUTH AT BEDTIME AS NEEDED FOR SLEEP    Allergies  Allergen Reactions  . Other Anaphylaxis    Sunflower oils    Patient Active Problem List   Diagnosis Date Noted  . COVID-19, past infectious stage, some mild postviral effects w/ cough/congestion 03/07/2020  . Reactive depression 08/15/2018  . MS (multiple sclerosis) ([place]) 07/19/2018  . Thoracic disc herniation 07/19/2018  . Lumbar radicular pain 05/18/2018  . NSAID long-term use 05/18/2018  . Diabetes type 2, uncontrolled ([place]) 04/25/2018  . Radicular syndrome of right lower extremity 04/25/2018  . Primary osteoarthritis of both knees 10/13/2017  . Seasonal allergic rhinitis 06/02/2017  . Class 1 obesity due to excess calories with serious comorbidity and body mass index (BMI) of 34.0 to 34.9 in adult 10/25/2016  . History of allergy to food additives 07/23/2016  . At risk for HIV due to heterosexual contact 03/10/2016  . Spinal stenosis of lumbar region with neurogenic claudication 03/10/2016  . Lipoma of abdominal wall 02/04/2016  . Vitamin D insufficiency 01/23/2016  . Tobacco dependency 01/23/2016   . Hypertension associated with diabetes ([place]) 01/21/2016  . Peripheral neuropathy 01/21/2016  . History of anemia 01/21/2016    Family History  Problem Relation Age of Onset  . Diabetes Mother   . Stroke Mother   . Diabetes Father   . Hyperlipidemia Father   . Hypertension Father   . Heart attack Father   . Diabetes Brother   . Hypertension Son   . Heart attack Paternal Grandmother   . Heart attack Paternal Grandfather   . Brain cancer Brother   . Brain cancer Nephew   . Diabetes type I Niece   . Rickets Sister     Social  History   Tobacco Use  Smoking Status Former Smoker  . Quit date: 03/24/2015  . Years since quitting: 5.2  Smokeless Tobacco Never Used    Past Surgical History:  Procedure Laterality Date  . APPENDECTOMY    . ENDOMETRIAL ABLATION  08/16/2015  . MANDIBLE SURGERY    . OOPHORECTOMY Right 10/2015   for ovarian abscess    Immunization History  Administered Date(s) Administered  . Influenza,inj,Quad PF,6+ Mos 02/04/2016, 10/25/2016, 09/29/2017, 11/15/2018, 12/06/2019  . PFIZER(Purple Top)SARS-COV-2 Vaccination 05/04/2019, 05/29/2019, 03/17/2020  . PPD Test 04/06/2016, 05/12/2016  . Tdap 02/04/2016  . Zoster Recombinat (Shingrix) 06/09/2020    Recent Results (from the past 2160 hour(s))  POCT HgB A1C     Status: Abnormal   Collection Time: 06/09/20 11:11 AM  Result Value Ref Range   Hemoglobin A1C 7.6 (A) 4.0 - 5.6 %   HbA1c POC (<> result, manual entry)     HbA1c, POC (prediabetic range)     HbA1c, POC (controlled diabetic range)      No results found.     All questions at time of visit were answered - patient instructed to contact office with any additional concerns or updates. ER/RTC precautions were reviewed with the patient as applicable.   Please note: manual typing as well as voice recognition software may have been used to produce this document - typos may escape review. Please contact Dr. Sheppard Coil for any needed clarifications.

## 2020-06-10 ENCOUNTER — Encounter: Payer: Self-pay | Admitting: Osteopathic Medicine

## 2020-06-10 ENCOUNTER — Other Ambulatory Visit: Payer: Self-pay

## 2020-06-10 ENCOUNTER — Other Ambulatory Visit: Payer: Self-pay | Admitting: Osteopathic Medicine

## 2020-06-10 DIAGNOSIS — M48061 Spinal stenosis, lumbar region without neurogenic claudication: Secondary | ICD-10-CM

## 2020-06-10 DIAGNOSIS — Z791 Long term (current) use of non-steroidal anti-inflammatories (NSAID): Secondary | ICD-10-CM

## 2020-06-11 MED ORDER — OMEPRAZOLE 20 MG PO CPDR: 20.0000 mg | DELAYED_RELEASE_CAPSULE | Freq: Every day | ORAL | 0 refills | Status: DC

## 2020-06-12 MED ORDER — AMBULATORY NON FORMULARY MEDICATION: 0 refills | Status: DC

## 2020-06-12 MED ORDER — TRAMADOL HCL 50 MG PO TABS: 50.0000 mg | ORAL_TABLET | Freq: Three times a day (TID) | ORAL | 0 refills | Status: DC | PRN

## 2020-06-20 ENCOUNTER — Telehealth: Payer: Self-pay | Admitting: Osteopathic Medicine

## 2020-06-20 DIAGNOSIS — R209 Unspecified disturbances of skin sensation: Secondary | ICD-10-CM

## 2020-06-20 NOTE — Telephone Encounter (Signed)
[name] order not accepted Changed  Printed order

## 2020-06-24 ENCOUNTER — Telehealth: Payer: Self-pay | Admitting: Diagnostic Neuroimaging

## 2020-06-24 ENCOUNTER — Encounter: Payer: Self-pay | Admitting: Diagnostic Neuroimaging

## 2020-06-24 ENCOUNTER — Ambulatory Visit: Payer: Medicaid Other | Admitting: Diagnostic Neuroimaging

## 2020-06-24 VITALS — BP 133/70 | HR 60 | Ht 66.0 in | Wt 235.0 lb

## 2020-06-24 DIAGNOSIS — M5124 Other intervertebral disc displacement, thoracic region: Secondary | ICD-10-CM

## 2020-06-24 DIAGNOSIS — G35 Multiple sclerosis: Secondary | ICD-10-CM

## 2020-06-24 DIAGNOSIS — M48062 Spinal stenosis, lumbar region with neurogenic claudication: Secondary | ICD-10-CM | POA: Diagnosis not present

## 2020-06-24 NOTE — Telephone Encounter (Signed)
Order fixed again, thanks [place]!

## 2020-06-24 NOTE — Addendum Note (Signed)
Addended by: Maryla Morrow on: 06/24/2020 02:06 PM   Modules accepted: Orders

## 2020-06-24 NOTE — Progress Notes (Signed)
Chief Complaint  Patient presents with  . Multiple Sclerosis    Rm 6 one year FU, "approved for disability now, back pain with standing; headaches/pains in my head but not a migraine, goes away; throbbing on top of my head; have dropped foot; right leg gets cold/numb - from pinched nerve in spine"    History of Present Illness:  UPDATE (06/24/20, VRP): Since last visit, doing about the same with MS. Some more low back pain and right leg numbness / WEAKNESS. Has not seen ortho in a while. Wants a second opinion for spine eval.    UPDATE (05/21/19, VRP): Since last visit, doing well; tolerating copaxone. Symptoms are stable. Some dizziness and headaches. No alleviating or aggravating factors.   UPDATE (07/19/18, VRP): MS mimic labs ruled out; positive JCV ab; slightly elevated LFTs (unknown cause). Since last visit, doing about the same. Symptoms are stable. Severity is moderate. No alleviating or aggravating factors. Tolerating meds. Labs completed. Now with dx of multiple sclerosis. Planning to start disease modifying therapy.   PRIOR HPI (05/17/18): 50 year old female here for evaluation of headache, dizziness, abnormal MRI of the brain.  History of hypertension and diabetes on medication. History of PrEP / truvada.   2018 patient had onset of low back pain radiating to the right leg.  She was diagnosed with severe spinal stenosis.  This is managed conservatively.  This is continued to get worse over time.  She has numbness or weakness of her right leg.  In 2019 patient had onset of numbness around her right rib cage area, near her bra line on the right side, which has been persistent.  December 2019 patient had onset of nonspecific pressure headaches in the front and back of her head, pressure behind her eyes.  No nausea or vomiting.  No sensitivity light or sound.  No prior similar headaches.  January 2020 patient had onset of intermittent blurred vision, intermittent dizziness and  balance difficulty, leaning to the left side.  As a result of the symptoms patient went to PCP and had MRI of the brain ordered.  This showed multiple round and ovoid periventricular, subcortical, T2 hyperintensities.  Facilitated diffusion and T1 black holes were noted.  Therefore possibility of underlying demyelinating disease or multiple sclerosis was raised.  No abnormal enhancing lesions were noted.    Observations/Objective:  GENERAL EXAM/CONSTITUTIONAL: Vitals:  Vitals:   06/24/20 1123  BP: 133/70  Pulse: 60  Weight: 235 lb (106.6 kg)  Height: 5\' 6"  (1.676 m)   Body mass index is 37.93 kg/m. Wt Readings from Last 3 Encounters:  06/24/20 235 lb (106.6 kg)  06/09/20 227 lb 1.3 oz (103 kg)  03/07/20 232 lb 1.3 oz (105.3 kg)    Patient is in no distress; well developed, nourished and groomed; neck is supple  CARDIOVASCULAR:  Examination of carotid arteries is normal; no carotid bruits  Regular rate and rhythm, no murmurs  Examination of peripheral vascular system by observation and palpation is normal  EYES:  Ophthalmoscopic exam of optic discs and posterior segments is normal; no papilledema or hemorrhages No exam data present  MUSCULOSKELETAL:  Gait, strength, tone, movements noted in Neurologic exam below  NEUROLOGIC: MENTAL STATUS:  No flowsheet data found.  awake, alert, oriented to person, place and time  recent and remote memory intact  normal attention and concentration  language fluent, comprehension intact, naming intact  fund of knowledge appropriate  CRANIAL NERVE:   2nd - no papilledema on fundoscopic  exam  2nd, 3rd, 4th, 6th - pupils equal and reactive to light, visual fields full to confrontation, extraocular muscles intact, no nystagmus  5th - facial sensation symmetric  7th - facial strength symmetric  8th - hearing intact  9th - palate elevates symmetrically, uvula midline  11th - shoulder shrug symmetric  12th - tongue  protrusion midline  MOTOR:   normal bulk and tone, full strength in the BUE, LLE; RLE 2-3  SENSORY:   normal and symmetric to light touch, temperature, vibration; EXCEPT DECR IN RIGHT LEG / FOOT  COORDINATION:   finger-nose-finger, fine finger movements normal  REFLEXES:   deep tendon reflexes TRACE and symmetric  GAIT/STATION:   narrow based gait; RIGHT LEG WEAKNESS GAIT    05/01/18 MRI brain - Periventricular greater than subcortical white matter lesions, without restricted diffusion or postcontrast enhancement, concerning for chronic multiple sclerosis. - No acute intracranial abnormality. No abnormal postcontrast enhancement. - No finding is identified which might contribute to headache.  05/29/18 MRI cervical spine - Signal abnormality within the right hemicord at C5 without postcontrast enhancement is most worrisome for demyelinating disease such as multiple sclerosis. - Cervical spondylosis as described above without central canal narrowing. Uncovertebral disease causing moderate to moderately severe foraminal narrowing is seen at C5-6 and C6-7.  05/29/18 MRI thoracic spine A large central and right paracentral disc extrusion at T7-8 contacts and deforms the cord. Edema within the cord subjacent to the disc extends within the right cord to approximately T8-9 level and could be secondary to cord compression but may also reflect demyelinating disease such as multiple sclerosis.  05/14/18 MRI lumbar spine 1. Diffuse lumbar spine spondylosis as described above. 2. At L4-5 there is a broad-based disc bulge with a broad central disc protrusion. Severe bilateral facet arthropathy. Severe spinal stenosis. Mild right foraminal stenosis. 3.  No acute osseous injury of the lumbar spine.   Assessment and Plan:  Dx:  1. MS (multiple sclerosis) ([place])   2. Thoracic disc herniation   3. Spinal stenosis of lumbar region with neurogenic claudication      multiple  sclerosis (RRMS) - continue copaxone for now (safe; has slightly elevated LFTs; diabetes; JCV ab positive; may consider ocrevus in future)  T7-8 disc herniation --> thoracic spinal cord compression (no major signs of myelopathy on exam)  - refer to neurosurgery (patient requests second opinion)  L4-5 severe lumbar spinal stenosis (worsening right leg weakness) - refer to neurosurgery (patient requests second opinion)   Follow Up Instructions:  - Return in about 6 months (around 12/25/2020).     Penni Bombard, MD 4/49/6759, 16:38 AM Certified in Neurology, Neurophysiology and Neuroimaging  [facility] [address] [place], [place] [address] [phone]

## 2020-06-24 NOTE — Telephone Encounter (Signed)
Faxed referral to [facility]. Phone: [phone]. Fax: [phone].

## 2020-06-30 ENCOUNTER — Encounter: Payer: Self-pay | Admitting: Osteopathic Medicine

## 2020-06-30 NOTE — Telephone Encounter (Signed)
Received fax from [facility]. Patient has an appointment with Dr. Zada Finders on 6/15 at 10:45.

## 2020-07-01 ENCOUNTER — Other Ambulatory Visit: Payer: Self-pay

## 2020-07-01 MED ORDER — GLIPIZIDE 10 MG PO TABS: 10.0000 mg | ORAL_TABLET | Freq: Two times a day (BID) | ORAL | 3 refills | Status: DC

## 2020-07-01 MED ORDER — METFORMIN HCL 1000 MG PO TABS: 1.0000 | ORAL_TABLET | Freq: Two times a day (BID) | ORAL | 3 refills | Status: DC

## 2020-07-02 LAB — HM DIABETES EYE EXAM

## 2020-07-08 ENCOUNTER — Ambulatory Visit (HOSPITAL_COMMUNITY): Admission: RE | Admit: 2020-07-08 | Payer: Medicaid Other | Source: Ambulatory Visit

## 2020-07-13 ENCOUNTER — Encounter: Payer: Self-pay | Admitting: Osteopathic Medicine

## 2020-07-13 DIAGNOSIS — M5416 Radiculopathy, lumbar region: Secondary | ICD-10-CM

## 2020-07-14 MED ORDER — GABAPENTIN 300 MG PO CAPS: 300.0000 mg | ORAL_CAPSULE | Freq: Every day | ORAL | 3 refills | Status: DC

## 2020-07-15 ENCOUNTER — Encounter: Payer: Self-pay | Admitting: Osteopathic Medicine

## 2020-07-16 ENCOUNTER — Encounter: Payer: Self-pay | Admitting: *Deleted

## 2020-07-16 ENCOUNTER — Telehealth: Payer: Self-pay | Admitting: *Deleted

## 2020-07-16 NOTE — Telephone Encounter (Signed)
Per my chart, patient has 1 month supply of glatiramer acetate. Called [facility] line, spoke with Melissa who stated they'll send two boxes,; advised her we are closed on Friday.  She stated order placed today, delivery scheduled for 07/21/20. Pharmacy confirmation [phone] Sent my chart to inform patient.

## 2020-07-22 ENCOUNTER — Encounter: Payer: Self-pay | Admitting: *Deleted

## 2020-07-22 NOTE — Telephone Encounter (Signed)
Received 2 boxes (2 month supply) of Glatiramaer Acetate for patient from [facility]. Put in sample refrigerator, sent her my chart to advise.

## 2020-07-23 NOTE — Telephone Encounter (Signed)
Called patient and advised medication has arrived, Glatiramer acetate 2 boxes, in drug sample refrigerator. She stated she'll pick it up on Monday. Patient verbalized understanding, appreciation.

## 2020-07-28 IMAGING — MR MRI LUMBAR SPINE WITHOUT CONTRAST
4 of 5 series · 26 of 48 positions shown · non-contrast
Comparison: 02/23/2016

CLINICAL DATA: Low back pain

EXAM:
MRI LUMBAR SPINE WITHOUT CONTRAST
TECHNIQUE: Multiplanar, multisequence MR imaging of the lumbar spine was
performed. No intravenous contrast was administered.

[Series 2: T2 · sagittal · 4.0mm · 0.81mm/px · 6 of 15 slices shown (1 of 2)]
[im 1/15]
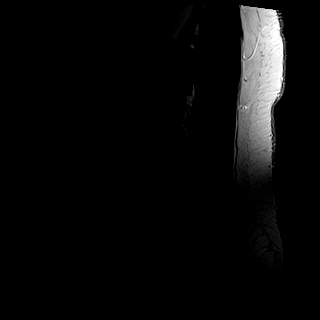
[im 3/15]
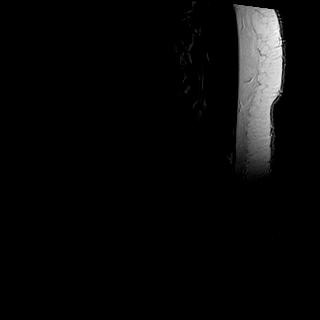
[im 6/15]
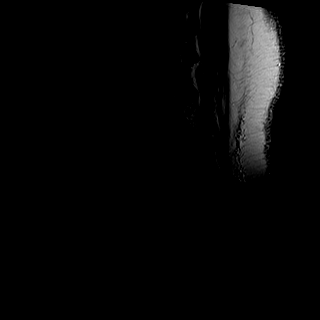
[im 9/15]
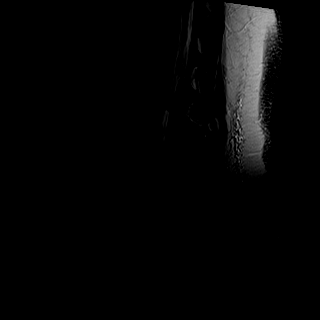
[im 12/15]
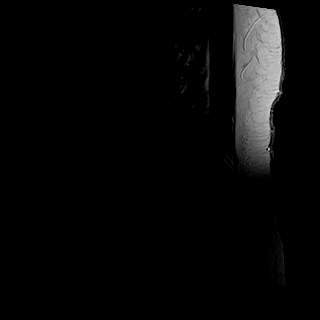
[im 15/15]
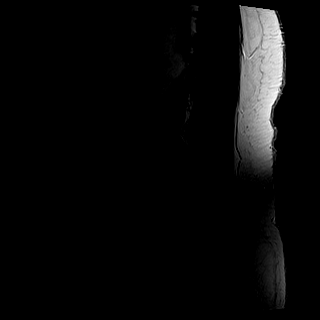

[Series 3: T1 · sagittal · 4.0mm · 0.41mm/px · 6 of 15 slices shown (1 of 2)]
[im 1/15]
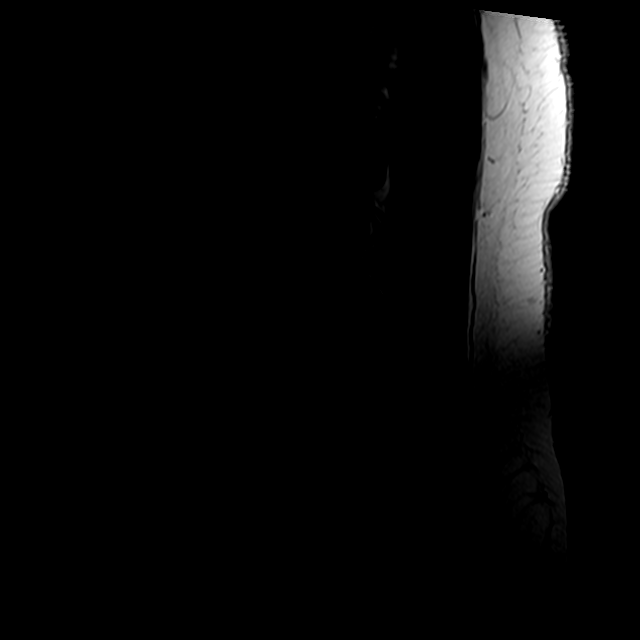
[im 3/15]
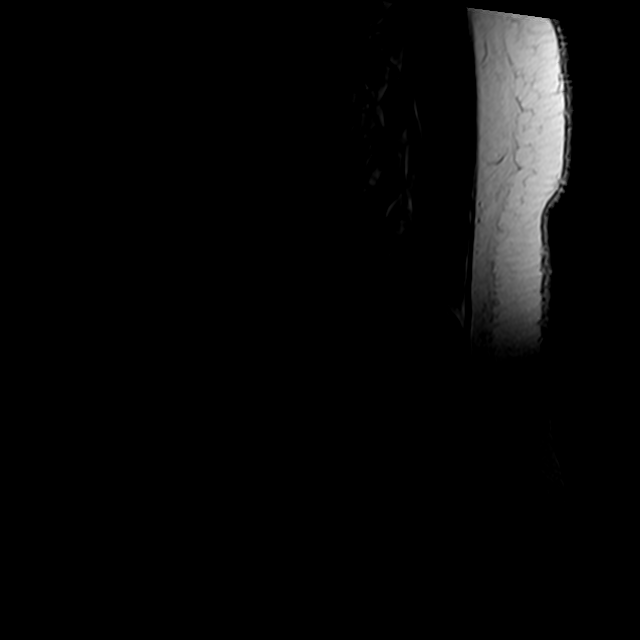
[im 6/15]
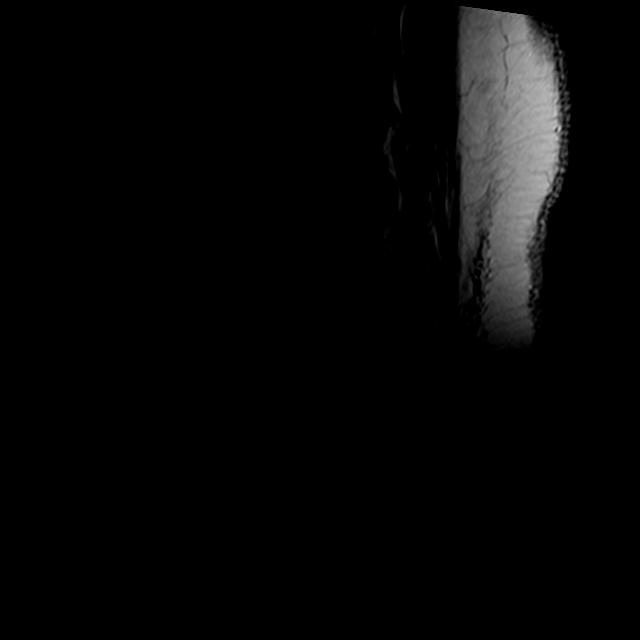
[im 9/15]
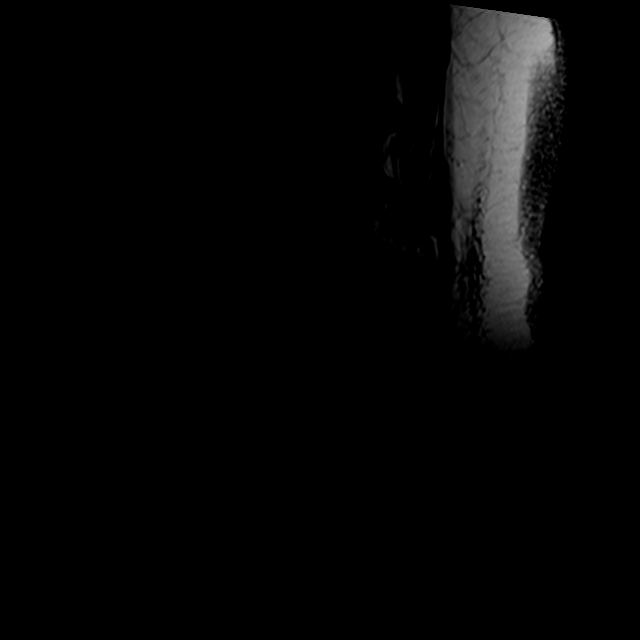
[im 12/15]
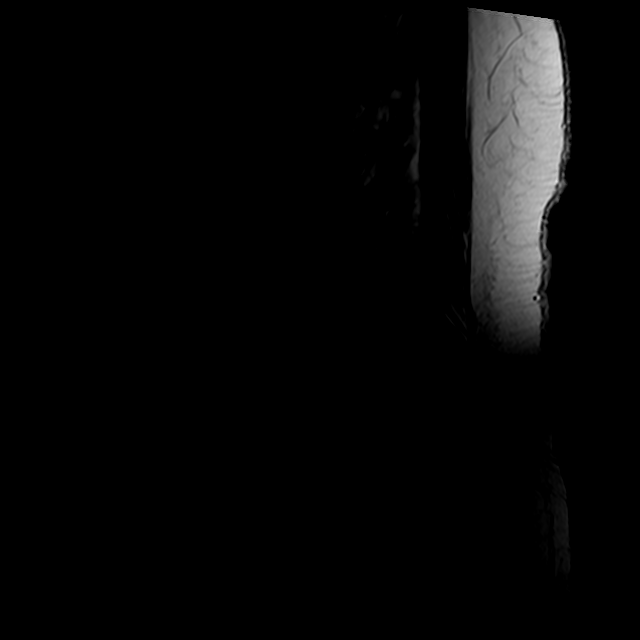
[im 15/15]
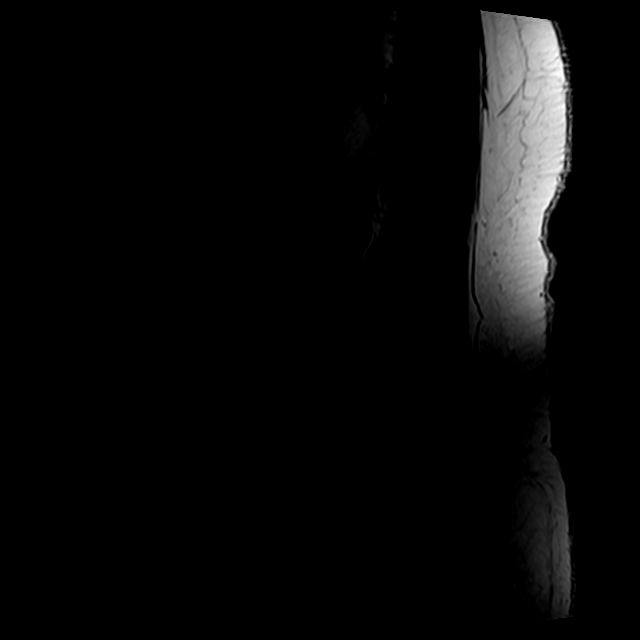

[Series 5: T2 · axial · 4.0mm · 0.78mm/px · z∈[-101,+96]mm · 9 of 36 slices shown (2 of 2)]
[im 1/36]
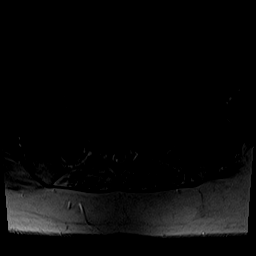
[im 6/36]
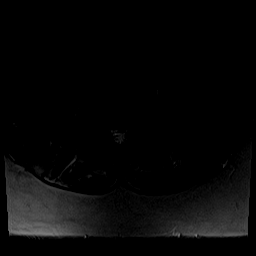
[im 11/36]
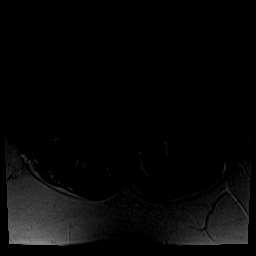
[im 16/36]
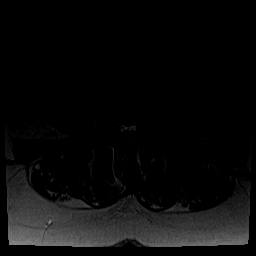
[im 18/36]
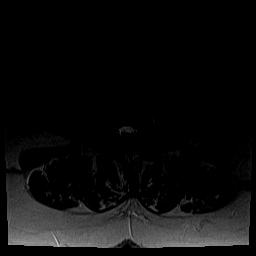
[im 21/36]
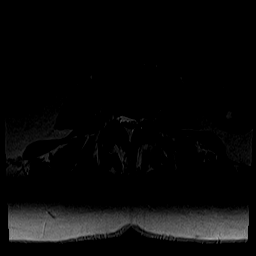
[im 26/36]
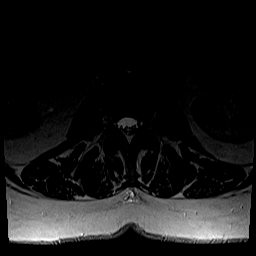
[im 31/36]
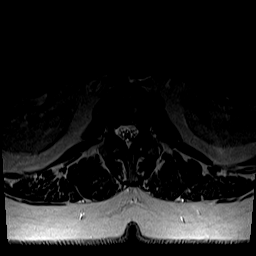
[im 36/36]
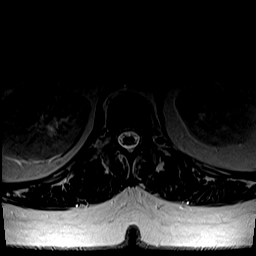

[Series 6: T1 · axial · 4.0mm · 0.39mm/px · z∈[-101,+72]mm · 5 of 36 slices shown (2 of 2)]
[im 1/36]
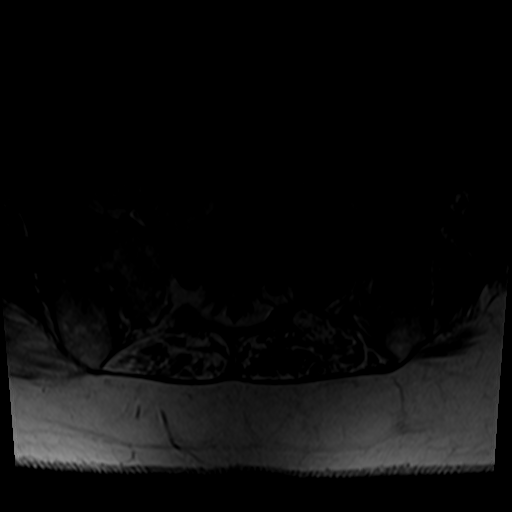
[im 6/36]
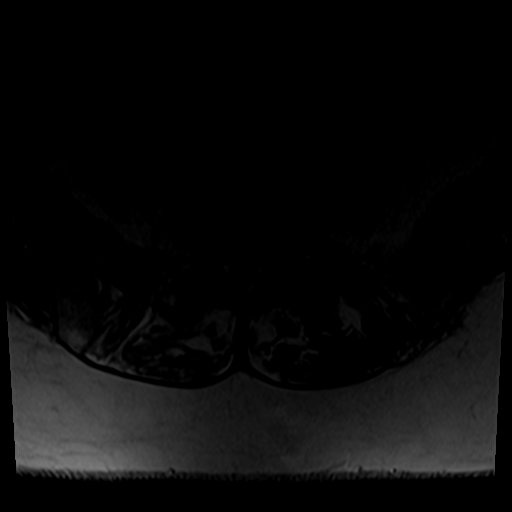
[im 11/36]
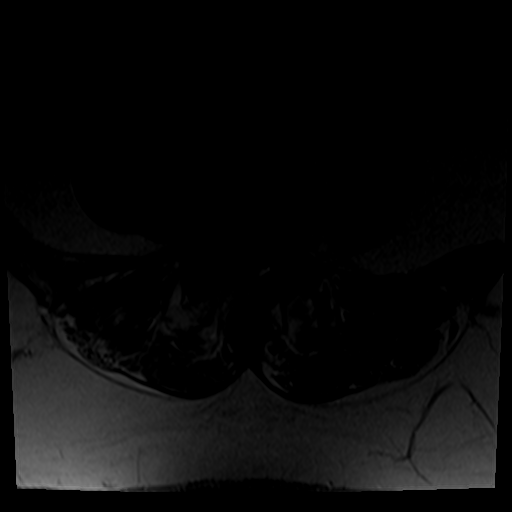
[im 18/36]
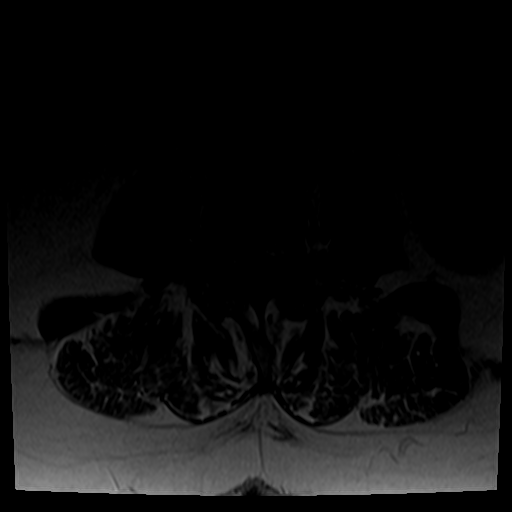
[im 31/36]
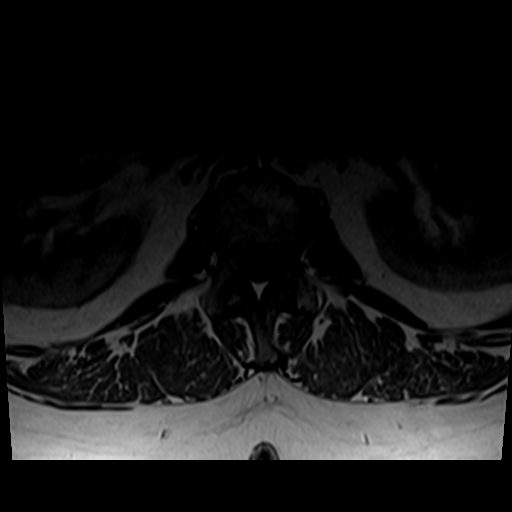

[26 of 48 positions shown; findings below may reference images not displayed]

FINDINGS: Segmentation:  Standard.

Alignment:  Physiologic.

Vertebrae:  No fracture, evidence of discitis, or bone lesion.

Conus medullaris and cauda equina: Conus extends to the T2 level.
Conus and cauda equina appear normal.

Paraspinal and other soft tissues: No acute paraspinal abnormality.

Disc levels:

Degenerative disc disease with disc height loss at L4-5. Mild disc
height loss at L2-3 and L3-4.

T12-L1: Mild broad-based disc bulge. No evidence of neural foraminal
stenosis. No central canal stenosis.

L1-L2: No significant disc bulge. No evidence of neural foraminal
stenosis. No central canal stenosis. Mild bilateral facet
arthropathy.

L2-L3: Mild broad-based disc bulge. Mild bilateral facet
arthropathy. No evidence of neural foraminal stenosis. No central
canal stenosis.

L3-L4: Broad-based disc bulge flattening the ventral thecal sac and
a tiny central disc protrusion. Mild bilateral facet arthropathy. No
evidence of neural foraminal stenosis. No central canal stenosis.

L4-L5: Broad-based disc bulge with a broad central disc protrusion.
Severe bilateral facet arthropathy. Severe spinal stenosis. Mild
right foraminal stenosis. No left foraminal stenosis.

L5-S1: No significant disc bulge. No evidence of neural foraminal
stenosis. No central canal stenosis. Moderate bilateral facet
arthropathy.
IMPRESSION: 1. Diffuse lumbar spine spondylosis as described above.
2. At L4-5 there is a broad-based disc bulge with a broad central
disc protrusion. Severe bilateral facet arthropathy. Severe spinal
stenosis. Mild right foraminal stenosis.
3.  No acute osseous injury of the lumbar spine.

## 2020-07-30 ENCOUNTER — Other Ambulatory Visit: Payer: Self-pay | Admitting: Sports Medicine

## 2020-07-30 ENCOUNTER — Encounter: Payer: Self-pay | Admitting: *Deleted

## 2020-07-30 DIAGNOSIS — M48061 Spinal stenosis, lumbar region without neurogenic claudication: Secondary | ICD-10-CM

## 2020-08-05 NOTE — Telephone Encounter (Signed)
Called patient who stated she is on her way now to pick up medication.

## 2020-08-06 NOTE — Telephone Encounter (Signed)
Per Candace CMT, Sandy RN patient picked up her Glatiramer Acetate yesterday afternoon.  [place] [phone] Lot [id number] Exp 10/2021

## 2020-08-08 ENCOUNTER — Ambulatory Visit (HOSPITAL_COMMUNITY)
Admission: RE | Admit: 2020-08-08 | Discharge: 2020-08-08 | Disposition: A | Discharge status: Home / Self Care | Payer: Medicaid Other | Source: Ambulatory Visit | Attending: Osteopathic Medicine | Admitting: Osteopathic Medicine

## 2020-08-08 ENCOUNTER — Other Ambulatory Visit: Payer: Self-pay

## 2020-08-08 DIAGNOSIS — R209 Unspecified disturbances of skin sensation: Secondary | ICD-10-CM | POA: Diagnosis not present

## 2020-08-08 NOTE — Progress Notes (Signed)
[name] exam has been completed.  Results can be found under chart review under CV PROC. 08/08/2020 2:26 PM Antavion Bartoszek RVT, RDMS

## 2020-08-20 ENCOUNTER — Other Ambulatory Visit: Payer: Self-pay | Admitting: Sports Medicine

## 2020-08-20 DIAGNOSIS — M48061 Spinal stenosis, lumbar region without neurogenic claudication: Secondary | ICD-10-CM

## 2020-08-21 NOTE — Telephone Encounter (Signed)
Med sent but needs to schedule app with Dr. Darene Lamer for chronici pain meds. Hasn't seen him since Oct

## 2020-09-03 ENCOUNTER — Other Ambulatory Visit: Payer: Self-pay | Admitting: Sports Medicine

## 2020-09-03 DIAGNOSIS — M48061 Spinal stenosis, lumbar region without neurogenic claudication: Secondary | ICD-10-CM

## 2020-09-03 MED ORDER — TRAMADOL HCL 50 MG PO TABS
50.0000 mg | ORAL_TABLET | Freq: Three times a day (TID) | ORAL | 0 refills | Status: DC | PRN
Start: 2020-09-03 — End: 2020-10-02

## 2020-09-04 ENCOUNTER — Telehealth: Payer: Self-pay | Admitting: *Deleted

## 2020-09-04 NOTE — Telephone Encounter (Signed)
Patient sent message re: needs refill of glatiramer acetate. Called [facility], spoke with Pam and placed order for 3 boxes. She stated the order will go out 09/23/20, attention "Stanton Kidney, office is closed on Fridays". Purchase Order [phone]. She advised patient reapply for assistance in Sept.

## 2020-09-09 ENCOUNTER — Encounter: Payer: Self-pay | Admitting: *Deleted

## 2020-09-09 ENCOUNTER — Ambulatory Visit: Payer: Medicaid Other | Admitting: Osteopathic Medicine

## 2020-09-09 NOTE — Telephone Encounter (Signed)
Received 3 boxes of glatiramer acetate 40 mg/mL.  [facility] [id number] Lot [id number] Exp 11/2021 Placed in drug sample refrigerator. Sent patient my chart to advise.

## 2020-09-10 ENCOUNTER — Telehealth: Payer: Self-pay | Admitting: General Practice

## 2020-09-10 NOTE — Telephone Encounter (Signed)
Transition Care Management Follow-up Telephone Call Date of discharge and from where: 09/09/20 from [facility] How have you been since you were released from the hospital? Still in pain. She has appointment with ortho tomorrow.  Any questions or concerns? No  Items Reviewed: Did the pt receive and understand the discharge instructions provided? Yes  Medications obtained and verified? Yes  Other? No  Any new allergies since your discharge? No  Dietary orders reviewed? Yes Do you have support at home? Yes   Home Care and Equipment/Supplies: Were home health services ordered? no   Functional Questionnaire: (I = Independent and D = Dependent) ADLs: I  Bathing/Dressing- I  Meal Prep- I  Eating- I  Maintaining continence- I  Transferring/Ambulation- I  Managing Meds- I  Follow up appointments reviewed:  PCP Hospital f/u appt confirmed? No  She was asked to see ortho at this time. [facility] f/u appt confirmed? Yes  Scheduled to see [facility] on 09/11/20. Are transportation arrangements needed? No  If their condition worsens, is the pt aware to call PCP or go to the Emergency Dept.? Yes Was the patient provided with contact information for the PCP's office or ED? Yes Was to pt encouraged to call back with questions or concerns? Yes

## 2020-09-17 NOTE — Telephone Encounter (Signed)
LVM reminding patient her medication is here, left office hours and phone #.

## 2020-10-01 ENCOUNTER — Ambulatory Visit: Payer: Medicaid Other | Admitting: Osteopathic Medicine

## 2020-10-01 ENCOUNTER — Other Ambulatory Visit: Payer: Self-pay | Admitting: Osteopathic Medicine

## 2020-10-01 ENCOUNTER — Other Ambulatory Visit: Payer: Self-pay | Admitting: Sports Medicine

## 2020-10-01 DIAGNOSIS — M48061 Spinal stenosis, lumbar region without neurogenic claudication: Secondary | ICD-10-CM

## 2020-10-01 NOTE — Telephone Encounter (Signed)
Patient picked up glatiramer acetate.

## 2020-10-09 ENCOUNTER — Encounter: Payer: Self-pay | Admitting: Osteopathic Medicine

## 2020-10-09 ENCOUNTER — Other Ambulatory Visit: Payer: Self-pay

## 2020-10-09 ENCOUNTER — Ambulatory Visit (INDEPENDENT_AMBULATORY_CARE_PROVIDER_SITE_OTHER): Payer: Medicaid Other | Admitting: Osteopathic Medicine

## 2020-10-09 VITALS — BP 161/90 | HR 70 | Temp 98.2°F | Wt 226.1 lb

## 2020-10-09 DIAGNOSIS — Z791 Long term (current) use of non-steroidal anti-inflammatories (NSAID): Secondary | ICD-10-CM | POA: Diagnosis not present

## 2020-10-09 DIAGNOSIS — E1159 Type 2 diabetes mellitus with other circulatory complications: Secondary | ICD-10-CM | POA: Diagnosis not present

## 2020-10-09 DIAGNOSIS — M48062 Spinal stenosis, lumbar region with neurogenic claudication: Secondary | ICD-10-CM | POA: Diagnosis not present

## 2020-10-09 DIAGNOSIS — Z23 Encounter for immunization: Secondary | ICD-10-CM | POA: Diagnosis not present

## 2020-10-09 DIAGNOSIS — I152 Hypertension secondary to endocrine disorders: Secondary | ICD-10-CM

## 2020-10-09 DIAGNOSIS — E1165 Type 2 diabetes mellitus with hyperglycemia: Secondary | ICD-10-CM

## 2020-10-09 DIAGNOSIS — M170 Bilateral primary osteoarthritis of knee: Secondary | ICD-10-CM

## 2020-10-09 DIAGNOSIS — G35 Multiple sclerosis: Secondary | ICD-10-CM

## 2020-10-09 DIAGNOSIS — M48061 Spinal stenosis, lumbar region without neurogenic claudication: Secondary | ICD-10-CM

## 2020-10-09 DIAGNOSIS — M5410 Radiculopathy, site unspecified: Secondary | ICD-10-CM

## 2020-10-09 MED ORDER — SERTRALINE HCL 50 MG PO TABS: 50.0000 mg | ORAL_TABLET | Freq: Every day | ORAL | 3 refills | Status: DC

## 2020-10-09 MED ORDER — LISINOPRIL-HYDROCHLOROTHIAZIDE 20-25 MG PO TABS: 1.0000 | ORAL_TABLET | Freq: Every day | ORAL | 3 refills | Status: DC

## 2020-10-09 MED ORDER — TRAZODONE HCL 150 MG PO TABS: 150.0000 mg | ORAL_TABLET | Freq: Every day | ORAL | 1 refills | Status: DC

## 2020-10-09 MED ORDER — OMEPRAZOLE 20 MG PO CPDR: 20.0000 mg | DELAYED_RELEASE_CAPSULE | Freq: Every day | ORAL | 3 refills | Status: DC

## 2020-10-09 MED ORDER — OZEMPIC (0.25 OR 0.5 MG/DOSE) 2 MG/1.5ML ~~LOC~~ SOPN: PEN_INJECTOR | SUBCUTANEOUS | 1 refills | Status: AC

## 2020-10-09 MED ORDER — TRAMADOL HCL 50 MG PO TABS: 50.0000 mg | ORAL_TABLET | Freq: Three times a day (TID) | ORAL | 1 refills | Status: DC | PRN

## 2020-10-09 NOTE — Progress Notes (Signed)
Jeanette [name] is a 50 y.o. female who presents to  [facility]  today, 10/09/20, seeking care for the following:  DM2 follow up  Anx/Dep and Insomnia - doing well on Trazodone, Zoloft. Trazodone used sparingly, using trmadol in evenings occasionally  HTN - stable on meds GERD - stable       ASSESSMENT & PLAN with other pertinent findings:  The primary encounter diagnosis was Uncontrolled type 2 diabetes mellitus with hyperglycemia ([place]). Diagnoses of Hypertension associated with diabetes ([place]), NSAID long-term use, Spinal stenosis of lumbar region with neurogenic claudication, Degenerative lumbar spinal stenosis, Radicular syndrome of right lower extremity, MS (multiple sclerosis) ([place]), and Primary osteoarthritis of both knees were also pertinent to this visit.    1. Uncontrolled type 2 diabetes mellitus with hyperglycemia (H[facility]) Adding Ozempic  2. Hypertension associated with diabetes ([place]) BP Readings from Last 3 Encounters:  10/09/20 (!) 161/90  06/24/20 133/70  06/09/20 (!) 127/53  BP above goal today, pt reports 0000000 systolic at home, working on weight loss, monitor closely   3. NSAID long-term use 4. Spinal stenosis of lumbar region with neurogenic claudication 5. Degenerative lumbar spinal stenosis 6. Radicular syndrome of right lower extremity 8. Primary osteoarthritis of both knees Refilled Tramadol ADVISED ON RISK SEROTONIN SYNDROME w/ higher dose and multiple meds SSRI, TCA, tramadol - advised not to take w/ trazodone  7. MS (multiple sclerosis) ([place]) Following w/ neurology    Patient Instructions  STARTING OZEMPIC FOR SUGARS  CONTINUE OTHER MEDICATIONS AS-IS   Orders Placed This Encounter  Procedures   Flu Vaccine QUAD 6+ mos PF IM (Fluarix Quad PF)   Varicella-zoster vaccine IM (Shingrix)    Meds ordered this encounter  Medications   lisinopril-hydrochlorothiazide (ZESTORETIC) 20-25 MG tablet     Sig: Take 1 tablet by mouth daily.    Dispense:  90 tablet    Refill:  3   sertraline (ZOLOFT) 50 MG tablet    Sig: Take 1 tablet (50 mg total) by mouth daily.    Dispense:  90 tablet    Refill:  3   omeprazole (PRILOSEC) 20 MG capsule    Sig: Take 1 capsule (20 mg total) by mouth daily.    Dispense:  90 capsule    Refill:  3   traMADol (ULTRAM) 50 MG tablet    Sig: Take 1 tablet (50 mg total) by mouth 3 (three) times daily as needed.    Dispense:  90 tablet    Refill:  1   traZODone (DESYREL) 150 MG tablet    Sig: Take 1 tablet (150 mg total) by mouth at bedtime.    Dispense:  90 tablet    Refill:  1   Semaglutide,0.25 or 0.'5MG'$ /DOS, (OZEMPIC, 0.25 OR 0.5 MG/DOSE,) 2 MG/1.5ML SOPN    Sig: Inject 0.25 mg into the skin once a week for 30 days, THEN 5 mg once a week.    Dispense:  7.5 mL    Refill:  1     See below for relevant physical exam findings  See below for recent lab and imaging results reviewed  Medications, allergies, PMH, PSH, SocH, FamH reviewed below    Follow-up instructions: Return in about 4 months (around 02/08/2021) for ESTABLISH W/ JOY - RECHECK A1C, FOLLOW W/ DR T FOR FOOT FRACTURE PAIN .  Exam:  BP (!) 161/90 (BP Location: Left Arm, Patient Position: Sitting, Cuff Size: Large)   Pulse 70   Temp 98.2 F (36.8 C) (Oral)   Wt 226 lb 1.3 oz (102.5 kg)   BMI 36.49 kg/m  Constitutional: VS see above. General Appearance: alert, well-developed, well-nourished, NAD Neck: No masses, trachea midline.  Respiratory: Normal respiratory effort. no wheeze, no rhonchi, no rales Cardiovascular: S1/S2 normal, no murmur, no rub/gallop auscultated. RRR.  Musculoskeletal: Gait normal. Symmetric and independent movement of all extremities Neurological: Normal balance/coordination. No tremor. Skin: warm, dry, intact.  Psychiatric: Normal judgment/insight. Normal mood and affect. Oriented x3.    Current Meds  Medication Sig   gabapentin (NEURONTIN) 300 MG capsule Take 1-2 capsules (300-600 mg total) by mouth at bedtime.   Glatiramer Acetate (COPAXONE) 40 MG/ML SOSY Inject 40 mg into the skin 3 (three) times a week.   glipiZIDE (GLUCOTROL) 10 MG tablet Take 1 tablet (10 mg total) by mouth 2 (two) times daily before a meal.   metFORMIN (GLUCOPHAGE) 1000 MG tablet Take 1 tablet (1,000 mg total) by mouth 2 (two) times daily.   Semaglutide,0.25 or 0.'5MG'$ /DOS, (OZEMPIC, 0.25 OR 0.5 MG/DOSE,) 2 MG/1.5ML SOPN Inject 0.25 mg into the skin once a week for 30 days, THEN 5 mg once a week.   [DISCONTINUED] omeprazole (PRILOSEC) 20 MG capsule Take 1 capsule (20 mg total) by mouth daily.   [DISCONTINUED] sertraline (ZOLOFT) 50 MG tablet Take 1 tablet (50 mg total) by mouth daily. Please cancel 25 mg.   [DISCONTINUED] traMADol (ULTRAM) 50 MG tablet TAKE ONE TABLET BY MOUTH THREE TIMES A DAY AS NEEDED   [DISCONTINUED] traZODone (DESYREL) 150 MG tablet Take 1 tablet (150 mg total) by mouth at bedtime.    Allergies  Allergen Reactions   Other Anaphylaxis    Sunflower oils    Patient Active Problem List   Diagnosis Date Noted   COVID-19, past infectious stage, some mild postviral effects w/ cough/congestion 03/07/2020   Reactive depression 08/15/2018   MS (multiple sclerosis) ([place]) 07/19/2018   Thoracic disc herniation 07/19/2018   Lumbar radicular pain 05/18/2018   NSAID long-term use 05/18/2018   Diabetes type 2, uncontrolled ([place]) 04/25/2018   Radicular syndrome of right lower extremity 04/25/2018   Primary osteoarthritis of both knees 10/13/2017   Seasonal allergic rhinitis 06/02/2017   Class 1 obesity due to excess calories with serious comorbidity and body mass index (BMI) of 34.0 to 34.9 in adult 10/25/2016   History of allergy to food additives 07/23/2016   At risk for HIV due to heterosexual contact 03/10/2016   Spinal stenosis of lumbar region with neurogenic claudication  03/10/2016   Lipoma of abdominal wall 02/04/2016   Vitamin D insufficiency 01/23/2016   Tobacco dependency 01/23/2016   Hypertension associated with diabetes ([place]) 01/21/2016   Peripheral neuropathy 01/21/2016   History of anemia 01/21/2016    Family History  Problem Relation Age of Onset   Diabetes Mother    Stroke Mother    Diabetes Father    Hyperlipidemia Father    Hypertension Father    Heart attack Father    Diabetes Brother    Hypertension Son    Heart attack Paternal Grandmother    Heart attack Paternal Grandfather    Brain cancer Brother    Brain cancer Nephew    Diabetes type I Niece    Rickets Sister     Social History   Tobacco Use  Smoking Status Former   Types: Cigarettes  Quit date: 03/24/2015   Years since quitting: 5.5  Smokeless Tobacco Never    Past Surgical History:  Procedure Laterality Date   APPENDECTOMY     ENDOMETRIAL ABLATION  08/16/2015   MANDIBLE SURGERY     OOPHORECTOMY Right 10/2015   for ovarian abscess    Immunization History  Administered Date(s) Administered   Influenza,inj,Quad PF,6+ Mos 02/04/2016, 10/25/2016, 09/29/2017, 11/15/2018, 12/06/2019, 10/09/2020   PFIZER(Purple Top)SARS-COV-2 Vaccination 05/04/2019, 05/29/2019, 03/17/2020   PPD Test 04/06/2016, 05/12/2016   Tdap 02/04/2016   Zoster Recombinat (Shingrix) 06/09/2020, 10/09/2020    No results found for this or any previous visit (from the past 2160 hour(s)).  No results found.     All questions at time of visit were answered - patient instructed to contact office with any additional concerns or updates. ER/RTC precautions were reviewed with the patient as applicable.   Please note: manual typing as well as voice recognition software may have been used to produce this document - typos may escape review. Please contact Dr. Sheppard Coil for any needed clarifications.

## 2020-10-09 NOTE — Patient Instructions (Signed)
STARTING OZEMPIC FOR SUGARS  CONTINUE OTHER MEDICATIONS AS-IS

## 2020-10-13 ENCOUNTER — Encounter: Payer: Self-pay | Admitting: Osteopathic Medicine

## 2020-10-20 NOTE — Telephone Encounter (Signed)
If patient has established elsewhere, she would really need to ask new PCP, any medication change needs a visit to discuss in detail and patient should not be asking for this kind of thing over my chart

## 2020-10-29 ENCOUNTER — Ambulatory Visit: Payer: Medicaid Other | Admitting: Sports Medicine

## 2020-11-05 ENCOUNTER — Telehealth: Payer: Self-pay | Admitting: *Deleted

## 2020-11-05 NOTE — Telephone Encounter (Signed)
This encounter was created in error - please disregard.

## 2020-11-05 NOTE — Telephone Encounter (Signed)
LVM requesting call back re: PAP application.

## 2020-11-07 ENCOUNTER — Other Ambulatory Visit: Payer: Self-pay | Admitting: Osteopathic Medicine

## 2020-11-07 DIAGNOSIS — M5416 Radiculopathy, lumbar region: Secondary | ICD-10-CM

## 2020-11-10 NOTE — Telephone Encounter (Signed)
Called patient LVM requesting call back to discuss Glatiramer acetate PAP.

## 2020-11-13 ENCOUNTER — Telehealth: Payer: Self-pay | Admitting: Diagnostic Neuroimaging

## 2020-11-13 NOTE — Telephone Encounter (Signed)
FYI: Pt called,  have transferred to another neurologist closer to home.

## 2020-11-13 NOTE — Telephone Encounter (Signed)
Noted  

## 2020-11-14 ENCOUNTER — Telehealth: Payer: Self-pay

## 2020-11-14 NOTE — Telephone Encounter (Signed)
Medication: Semaglutide,0.25 or 0.5MG /DOS, (OZEMPIC, 0.25 OR 0.5 MG/DOSE,) 2 MG/1.5ML SOPN Prior authorization submitted via CoverMyMeds on 11/14/2020 [place] submission pending

## 2020-11-17 NOTE — Telephone Encounter (Signed)
Called [facility], spoke with Melissa and I informed her the patient called and stated she's transferring her care to another provider. Patient will need to call [facility] and provide fax # for her new neurologist. [facility] will fax a reapplication for PAP to her new MD.  I called patient and left detailed VM advising her of above. I gave her [facility] #.

## 2020-11-17 NOTE — Telephone Encounter (Signed)
Called [facility], closed until 9 pm EST. Will call them back.

## 2020-11-19 NOTE — Telephone Encounter (Signed)
Medication: Semaglutide,0.25 or 0.5MG /DOS, (OZEMPIC, 0.25 OR 0.5 MG/DOSE,) 2 MG/1.5ML SOPN Prior authorization determination received Prior authorization is not required. Note from determination states: "The pharmacy must enter the appropriate clarification code to override the rejection. The rejections may include therapeutic duplications, drug-drug inertactions, and/or high dose alerts. These rejections may also take the member's recent prescription history into consideration. The pharmacy processing the claim is able to override these rejection(s) for this request"  Patient aware via: [place] aware: Yes Provider aware via this encounter

## 2021-01-06 ENCOUNTER — Ambulatory Visit: Payer: Medicaid Other | Admitting: Diagnostic Neuroimaging

## 2021-07-31 ENCOUNTER — Other Ambulatory Visit: Payer: Self-pay | Admitting: Osteopathic Medicine

## 2023-07-21 ENCOUNTER — Other Ambulatory Visit (INDEPENDENT_AMBULATORY_CARE_PROVIDER_SITE_OTHER)

## 2023-07-21 ENCOUNTER — Encounter: Payer: Self-pay | Admitting: Sports Medicine

## 2023-07-21 ENCOUNTER — Ambulatory Visit

## 2023-07-21 ENCOUNTER — Ambulatory Visit (INDEPENDENT_AMBULATORY_CARE_PROVIDER_SITE_OTHER): Admitting: Sports Medicine

## 2023-07-21 ENCOUNTER — Telehealth: Payer: Self-pay | Admitting: Sports Medicine

## 2023-07-21 DIAGNOSIS — M170 Bilateral primary osteoarthritis of knee: Secondary | ICD-10-CM | POA: Diagnosis not present

## 2023-07-21 DIAGNOSIS — R29898 Other symptoms and signs involving the musculoskeletal system: Secondary | ICD-10-CM | POA: Diagnosis not present

## 2023-07-21 MED ORDER — TRIAMCINOLONE ACETONIDE 40 MG/ML IJ SUSP
80.0000 mg | Freq: Once | INTRAMUSCULAR | Status: AC
  Administered 2023-07-21: 80 mg via INTRA_ARTICULAR

## 2023-07-21 NOTE — Telephone Encounter (Signed)
 Emailed [facility] about brace.

## 2023-07-21 NOTE — Addendum Note (Signed)
 Addended by: Montgomery Apgar on: 07/21/2023 11:27 AM   Modules accepted: Orders

## 2023-07-21 NOTE — Telephone Encounter (Signed)
 2 things: 2 things:  1: [facility] approval, both knees. 2: Please contact Deadra Everts with [facility] to see if we can get a knee ankle-foot orthosis for right leg paralysis.  Oh yeah...please!'

## 2023-07-21 NOTE — Progress Notes (Addendum)
    Procedures performed today:    Procedure: Real-time Ultrasound Guided injection of the left knee Device: Samsung HS60  Verbal informed consent obtained.  Time-out conducted.  Noted no overlying erythema, induration, or other signs of local infection.  Skin prepped in a sterile fashion.  Local anesthesia: Topical Ethyl chloride.  With sterile technique and under real time ultrasound guidance: 1 cc Kenalog  40, 2 cc lidocaine, 2 cc bupivacaine injected easily Completed without difficulty  Advised to call if fevers/chills, erythema, induration, drainage, or persistent bleeding.  Images permanently stored and available for review in PACS.  Impression: Technically successful ultrasound guided injection.  Procedure: Real-time Ultrasound Guided injection of the right knee Device: Samsung HS60  Verbal informed consent obtained.  Time-out conducted.  Noted no overlying erythema, induration, or other signs of local infection.  Skin prepped in a sterile fashion.  Local anesthesia: Topical Ethyl chloride.  With sterile technique and under real time ultrasound guidance: 1 cc Kenalog  40, 2 cc lidocaine, 2 cc bupivacaine injected easily Completed without difficulty  Advised to call if fevers/chills, erythema, induration, drainage, or persistent bleeding.  Images permanently stored and available for review in PACS.  Impression: Technically successful ultrasound guided injection.  Independent interpretation of notes and tests performed by another provider:   None.  Brief History, Exam, Impression, and Recommendations:    Primary osteoarthritis of both knees This very pleasant 58 a female returns, she has known bilateral knee osteoarthritis, we last injected her knees in 2021, she did work with [employer] sports medicine. She desires to reestablish care here, we will do bilateral knee injections, her set up for [facility]. She has not had x-rays in some time so we will get bilateral x-rays  today.  For insurance purposes she has failed greater than 6 weeks of physical therapy, analgesics, steroid injections and has x-ray confirmed osteoarthritis.  Weakness of right leg Sihaam had lumbar radiculopathy, she had a lumbar decompression but is still left with some weakness and foot drop on the right. On exam she is ambulatory, but she has significant weakness, approximately 2/5 to flexion at the right knee and 0/5 to dorsiflexion of the right foot. This type of gait does tend to make her knee hurt, I think she would be a good candidate for a custom knee orthosis, due to her knee hyperextension, we do need to prevent hyperextension for her to maintain a normal gait. She does have significant pseudo instability with varus and valgus stress testing. She does have a carbon fiber AFO that she can wear when ambulating to mitigate her foot drop. I would like to see if we can get [employer] to fit her for one.    ____________________________________________ Debby PARAS. Curtis, M.D., ABFM., CAQSM., AME. Primary Care and Sports Medicine [facility]  Adjunct Professor of [facility] Medicine  [employer]  Restaurant manager, fast food

## 2023-07-21 NOTE — Assessment & Plan Note (Addendum)
 This very pleasant 22 a female returns, she has known bilateral knee osteoarthritis, we last injected her knees in 2021, she did work with [employer] sports medicine. She desires to reestablish care here, we will do bilateral knee injections, her set up for [facility]. She has not had x-rays in some time so we will get bilateral x-rays today.  For insurance purposes she has failed greater than 6 weeks of physical therapy, analgesics, steroid injections and has x-ray confirmed osteoarthritis.

## 2023-07-21 NOTE — Assessment & Plan Note (Addendum)
 Jeanette [name] had lumbar radiculopathy, she had a lumbar decompression but is still left with some weakness and foot drop on the right. On exam she is ambulatory, but she has significant weakness, approximately 2/5 to flexion at the right knee and 0/5 to dorsiflexion of the right foot. This type of gait does tend to make her knee hurt, I think she would be a good candidate for a custom knee orthosis, due to her knee hyperextension, we do need to prevent hyperextension for her to maintain a normal gait. She does have significant pseudo instability with varus and valgus stress testing. She does have a carbon fiber AFO that she can wear when ambulating to mitigate her foot drop. I would like to see if we can get [employer] to fit her for one.

## 2023-07-21 NOTE — Telephone Encounter (Signed)
 Submitted information for benefits investigation to [facility].

## 2023-07-22 NOTE — Telephone Encounter (Addendum)
 Benefits Investigation Details received from [employer] Injection: Orthovisc/Monovisc PA required: Yes May fill through: Buy and Bill  OV Copay/Coinsurance: % Product Copay: 20% Administration Coinsurance: 20% Administration Copay: $ Deductible: $257 ([id number]:$[id number]) Individual  Benefits Investigation Started 07/21/2023  Deductible applies. Since deductible has been met, patient is responsible for coinsurance. Once OOP has been met, patient is covered at 100%.  Prior Authorization is required for the drug through [facility].

## 2023-07-25 ENCOUNTER — Ambulatory Visit: Payer: Self-pay | Admitting: Sports Medicine

## 2023-07-27 ENCOUNTER — Telehealth: Payer: Self-pay | Admitting: Sports Medicine

## 2023-07-27 NOTE — Telephone Encounter (Signed)
 Copied from CRM [id number]. Topic: General - Other >> Jul 27, 2023  2:54 PM Kevelyn M wrote: Reason for CRM: Worley Headings with [employer] was calling about Prior Auth for injection for the patient. The cut off date is June 20 and was trying to see if we could answer the questions from the fax that was sent yesterday.

## 2023-08-03 ENCOUNTER — Telehealth: Payer: Self-pay

## 2023-08-03 NOTE — Telephone Encounter (Signed)
 Resubmitted a PA for Synvisc

## 2023-08-03 NOTE — Telephone Encounter (Signed)
 Copied from CRM [id number]. Topic: Clinical - Medication Prior Auth >> Aug 03, 2023  4:50 PM Fredrica W wrote: Reason for CRM: [facility] called to confirm Medication for Patient prior authorization for Synvisc. Need to know if its regular or Synvisc one. Call call back PA # [id number] Phone #: [phone]

## 2023-08-04 NOTE — Telephone Encounter (Signed)
 Spoke with rep from [facility], she stated that Synvisc does not require a PA.   Spoke with patient to make her awre of her financial responsibility. She verbalized understanding and stated that she would need to be billed and would make payments on the cost. She is traveling through the month off July and wants to schedule in August.

## 2023-08-04 NOTE — Telephone Encounter (Signed)
 Spoke with rep at [facility]. She ran a manual test claim and PA is not required for the synvisc.

## 2023-08-05 NOTE — Telephone Encounter (Signed)
 Synvisc has been ordered. Patient will advise our office when she is ready to begin the injection process.

## 2023-08-17 ENCOUNTER — Ambulatory Visit: Admitting: Urgent Care

## 2023-09-01 ENCOUNTER — Ambulatory Visit (INDEPENDENT_AMBULATORY_CARE_PROVIDER_SITE_OTHER): Admitting: Sports Medicine

## 2023-09-01 ENCOUNTER — Other Ambulatory Visit (INDEPENDENT_AMBULATORY_CARE_PROVIDER_SITE_OTHER)

## 2023-09-01 DIAGNOSIS — M170 Bilateral primary osteoarthritis of knee: Secondary | ICD-10-CM | POA: Diagnosis not present

## 2023-09-01 MED ORDER — HYLAN G-F 20 16 MG/2ML IX SOSY
32.0000 mg | PREFILLED_SYRINGE | Freq: Once | INTRA_ARTICULAR | Status: AC
  Administered 2023-09-01: 32 mg via INTRA_ARTICULAR

## 2023-09-01 NOTE — Progress Notes (Signed)
    Procedures performed today:    Procedure: Real-time Ultrasound Guided injection of the left knee Device: Samsung HS60  Verbal informed consent obtained.  Time-out conducted.  Noted no overlying erythema, induration, or other signs of local infection.  Skin prepped in a sterile fashion.  Local anesthesia: Topical Ethyl chloride.  With sterile technique and under real time ultrasound guidance: Mild effusion noted, 1 syringe of Synvisc injected easily into the suprapatellar recess through a 22-gauge needle.   Completed without difficulty  Advised to call if fevers/chills, erythema, induration, drainage, or persistent bleeding.  Images permanently stored and available for review in PACS.  Impression: Technically successful ultrasound guided injection.  Procedure: Real-time Ultrasound Guided injection of the right knee Device: Samsung HS60  Verbal informed consent obtained.  Time-out conducted.  Noted no overlying erythema, induration, or other signs of local infection.  Skin prepped in a sterile fashion.  Local anesthesia: Topical Ethyl chloride.  With sterile technique and under real time ultrasound guidance: Mild effusion noted, 1 syringe of Synvisc injected easily into the suprapatellar recess through a 22-gauge needle.   Completed without difficulty  Advised to call if fevers/chills, erythema, induration, drainage, or persistent bleeding.  Images permanently stored and available for review in PACS.  Impression: Technically successful ultrasound guided injection.  Independent interpretation of notes and tests performed by another provider:   None.  Brief History, Exam, Impression, and Recommendations:    Primary osteoarthritis of both knees This pleasant 53 year old female returns, known bilateral knee osteoarthritis, last steroid injection was June. Today we did Synvisc No. 1 of 3 both knees, return in 1 week for #2 of  3.    ____________________________________________ Debby PARAS. Curtis, M.D., ABFM., CAQSM., AME. Primary Care and Sports Medicine [facility]  Adjunct Professor of [facility] Medicine  [employer]  Restaurant manager, fast food

## 2023-09-01 NOTE — Assessment & Plan Note (Signed)
 This pleasant 53 year old female returns, known bilateral knee osteoarthritis, last steroid injection was June. Today we did Synvisc No. 1 of 3 both knees, return in 1 week for #2 of 3.

## 2023-09-01 NOTE — Addendum Note (Signed)
 Addended by: OLEY CHIQUITA CROME on: 09/01/2023 11:42 AM   Modules accepted: Orders

## 2023-09-06 ENCOUNTER — Ambulatory Visit: Admitting: Sports Medicine

## 2023-09-12 ENCOUNTER — Ambulatory Visit (INDEPENDENT_AMBULATORY_CARE_PROVIDER_SITE_OTHER): Admitting: Sports Medicine

## 2023-09-12 ENCOUNTER — Other Ambulatory Visit (INDEPENDENT_AMBULATORY_CARE_PROVIDER_SITE_OTHER)

## 2023-09-12 DIAGNOSIS — M170 Bilateral primary osteoarthritis of knee: Secondary | ICD-10-CM | POA: Diagnosis not present

## 2023-09-12 DIAGNOSIS — M48061 Spinal stenosis, lumbar region without neurogenic claudication: Secondary | ICD-10-CM

## 2023-09-12 MED ORDER — HYLAN G-F 20 16 MG/2ML IX SOSY
16.0000 mg | PREFILLED_SYRINGE | Freq: Once | INTRA_ARTICULAR | Status: AC
Start: 2023-09-12 — End: 2023-09-12
  Administered 2023-09-12: 16 mg via INTRA_ARTICULAR

## 2023-09-12 MED ORDER — TRAMADOL HCL 50 MG PO TABS: 50.0000 mg | ORAL_TABLET | Freq: Three times a day (TID) | ORAL | 1 refills | Status: DC | PRN

## 2023-09-12 NOTE — Assessment & Plan Note (Signed)
 Synvisc No. 2 of 3 both knees, return in 1 week for #3 of 3. Tramadol  for pain in the meantime.

## 2023-09-12 NOTE — Progress Notes (Signed)
    Procedures performed today:    Procedure: Real-time Ultrasound Guided injection of the left knee Device: Samsung HS60  Verbal informed consent obtained.  Time-out conducted.  Noted no overlying erythema, induration, or other signs of local infection.  Skin prepped in a sterile fashion.  Local anesthesia: Topical Ethyl chloride.  With sterile technique and under real time ultrasound guidance: Mild effusion noted, 1 syringe of Synvisc injected easily into the suprapatellar recess through a 22-gauge needle.   Completed without difficulty  Advised to call if fevers/chills, erythema, induration, drainage, or persistent bleeding.  Images permanently stored and available for review in PACS.  Impression: Technically successful ultrasound guided injection.   Procedure: Real-time Ultrasound Guided injection of the right knee Device: Samsung HS60  Verbal informed consent obtained.  Time-out conducted.  Noted no overlying erythema, induration, or other signs of local infection.  Skin prepped in a sterile fashion.  Local anesthesia: Topical Ethyl chloride.  With sterile technique and under real time ultrasound guidance: Mild effusion noted, 1 syringe of Synvisc injected easily into the suprapatellar recess through a 22-gauge needle.   Completed without difficulty  Advised to call if fevers/chills, erythema, induration, drainage, or persistent bleeding.  Images permanently stored and available for review in PACS.  Impression: Technically successful ultrasound guided injection.  Independent interpretation of notes and tests performed by another provider:   None.  Brief History, Exam, Impression, and Recommendations:    Primary osteoarthritis of both knees Synvisc No. 2 of 3 both knees, return in 1 week for #3 of 3. Tramadol  for pain in the meantime.    ____________________________________________ Debby PARAS. Curtis, M.D., ABFM., CAQSM., AME. Primary Care and Sports Medicine [facility]  Adjunct Professor of [facility] Medicine  [employer]  Restaurant manager, fast food

## 2023-09-12 NOTE — Addendum Note (Signed)
 Addended by: OLEY CHIQUITA CROME on: 09/12/2023 09:48 AM   Modules accepted: Orders

## 2023-09-13 ENCOUNTER — Ambulatory Visit: Admitting: Urgent Care

## 2023-09-19 ENCOUNTER — Ambulatory Visit: Admitting: Urgent Care

## 2023-09-20 ENCOUNTER — Other Ambulatory Visit (INDEPENDENT_AMBULATORY_CARE_PROVIDER_SITE_OTHER)

## 2023-09-20 ENCOUNTER — Encounter: Payer: Self-pay | Admitting: Sports Medicine

## 2023-09-20 ENCOUNTER — Ambulatory Visit (INDEPENDENT_AMBULATORY_CARE_PROVIDER_SITE_OTHER): Admitting: Sports Medicine

## 2023-09-20 DIAGNOSIS — M48062 Spinal stenosis, lumbar region with neurogenic claudication: Secondary | ICD-10-CM

## 2023-09-20 DIAGNOSIS — M170 Bilateral primary osteoarthritis of knee: Secondary | ICD-10-CM | POA: Diagnosis not present

## 2023-09-20 MED ORDER — HYLAN G-F 20 16 MG/2ML IX SOSY
32.0000 mg | PREFILLED_SYRINGE | Freq: Once | INTRA_ARTICULAR | Status: AC
  Administered 2023-09-20 (×2): 32 mg via INTRA_ARTICULAR

## 2023-09-20 NOTE — Addendum Note (Signed)
 Addended by: OLEY CHIQUITA CROME on: 09/20/2023 10:18 AM   Modules accepted: Orders

## 2023-09-20 NOTE — Progress Notes (Signed)
    Procedures performed today:    Procedure: Real-time Ultrasound Guided injection of the left knee Device: Samsung HS60  Verbal informed consent obtained.  Time-out conducted.  Noted no overlying erythema, induration, or other signs of local infection.  Skin prepped in a sterile fashion.  Local anesthesia: Topical Ethyl chloride.  With sterile technique and under real time ultrasound guidance: Mild effusion noted, 1 syringe of Synvisc injected easily into the suprapatellar recess through a 22-gauge needle.   Completed without difficulty  Advised to call if fevers/chills, erythema, induration, drainage, or persistent bleeding.  Images permanently stored and available for review in PACS.  Impression: Technically successful ultrasound guided injection.   Procedure: Real-time Ultrasound Guided injection of the right knee Device: Samsung HS60  Verbal informed consent obtained.  Time-out conducted.  Noted no overlying erythema, induration, or other signs of local infection.  Skin prepped in a sterile fashion.  Local anesthesia: Topical Ethyl chloride.  With sterile technique and under real time ultrasound guidance: Mild effusion noted, 1 syringe of Synvisc injected easily into the suprapatellar recess through a 22-gauge needle.   Completed without difficulty  Advised to call if fevers/chills, erythema, induration, drainage, or persistent bleeding.  Images permanently stored and available for review in PACS.  Impression: Technically successful ultrasound guided injection.  Independent interpretation of notes and tests performed by another provider:   None.  Brief History, Exam, Impression, and Recommendations:    Primary osteoarthritis of both knees Synvisc 3 of 3 both knees, return in 6 weeks.  Spinal stenosis of lumbar region with neurogenic claudication She is postop severe spinal stenosis, she has residual foot drop on the right. Her plastic AFO is uncomfortable and causes  abrasions in her leg, I have suggested a low-profile medial or lateral carbon fiber AFO, we have done a referral to bionic prosthetics and orthotics for this. She is ambulatory.    ____________________________________________ Jeanette [name], M.D., ABFM., CAQSM., AME. Primary Care and Sports Medicine [facility]  Adjunct Professor of [facility] Medicine  [employer]  Restaurant manager, fast food

## 2023-09-20 NOTE — Assessment & Plan Note (Signed)
 She is postop severe spinal stenosis, she has residual foot drop on the right. Her plastic AFO is uncomfortable and causes abrasions in her leg, I have suggested a low-profile medial or lateral carbon fiber AFO, we have done a referral to bionic prosthetics and orthotics for this. She is ambulatory.

## 2023-09-20 NOTE — Patient Instructions (Signed)
 Jeanette [name]

## 2023-09-20 NOTE — Assessment & Plan Note (Signed)
 Synvisc 3 of 3 both knees, return in 6 weeks.

## 2023-09-23 NOTE — Therapy (Signed)
 OUTPATIENT PHYSICAL THERAPY LOWER EXTREMITY EVALUATION   Patient Name: Jeanette [name] MRN: [id number] DOB:Jun 15, 1970, 53 y.o., female Today's Date: 09/26/2023  END OF SESSION:  PT End of Session - 09/26/23 0842     Visit Number 1    Number of Visits 17    Date for PT Re-Evaluation 11/21/23    Authorization Type [employer] MDC    Authorization Time Period no auth per notes    Progress Note Due on Visit 10    PT Start Time 0845    PT Stop Time 0930    PT Time Calculation (min) 45 min    Activity Tolerance Patient tolerated treatment well          Past Medical History:  Diagnosis Date   Abnormal Pap smear of cervix    Anemia    Dysmenorrhea    Hemorrhoids    Hypertension    Menorrhagia    Pelvic adhesions    Peripheral neuropathy 01/21/2016   Spinal stenosis at L4-L5 level 03/10/2016   Tubo-ovarian abscess 10/2015   White matter abnormality on MRI of brain 05/01/2018   Past Surgical History:  Procedure Laterality Date   APPENDECTOMY     ENDOMETRIAL ABLATION  08/16/2015   MANDIBLE SURGERY     OOPHORECTOMY Right 10/2015   for ovarian abscess   Patient Active Problem List   Diagnosis Date Noted   Weakness of right leg 07/21/2023   COVID-19, past infectious stage, some mild postviral effects w/ cough/congestion 03/07/2020   Reactive depression 08/15/2018   MS (multiple sclerosis) (HCC) 07/19/2018   Thoracic disc herniation 07/19/2018   Lumbar radicular pain 05/18/2018   NSAID long-term use 05/18/2018   Diabetes type 2, uncontrolled 04/25/2018   Radicular syndrome of right lower extremity 04/25/2018   Primary osteoarthritis of both knees 10/13/2017   Seasonal allergic rhinitis 06/02/2017   Class 1 obesity due to excess calories with serious comorbidity and body mass index (BMI) of 34.0 to 34.9 in adult 10/25/2016   History of allergy to food additives 07/23/2016   At risk for HIV due to heterosexual contact 03/10/2016   Spinal stenosis of lumbar region with neurogenic  claudication 03/10/2016   Lipoma of abdominal wall 02/04/2016   Vitamin D  insufficiency 01/23/2016   Tobacco dependency 01/23/2016   Hypertension associated with diabetes (HCC) 01/21/2016   Peripheral neuropathy 01/21/2016   History of anemia 01/21/2016    PCP: No PCP in chart  REFERRING PROVIDER: Curtis Debby PARAS, MD  REFERRING DIAG: M17.0 (ICD-10-CM) - Primary osteoarthritis of both knees  THERAPY DIAG:  Pain in both knees, unspecified chronicity  Muscle weakness (generalized)  Other abnormalities of gait and mobility  Rationale for Evaluation and Treatment: Rehabilitation  ONSET DATE: ~2018  SUBJECTIVE:   SUBJECTIVE STATEMENT: Reports history of R LE issues w/ back surgery, MS, and drop foot. States her R knee feels like it will hyperextend when she is walking. Tends to veer to R.  States she feels like back surgery helped with her pain but hasn't really changed weakness. Does have L knee pain at times but mostly R sided. Has to use shopping cart in grocery store.  Got knee gel injections last week - feels like it helped pain quite a bit. States she was also told she may need an AFO.  PERTINENT HISTORY: HTN, peripheral neuropathy, DM2, MS Self reports drop foot R  PAIN:  Are you having pain: none Location/description: R>L knee Best-worst over past week: 0-5/10  - aggravating factors: walking ~22min,  stairs, end of day after more activity - Easing factors: knee brace, rest, medication  PRECAUTIONS: MS, prior back surgery (2023, thinks she had a fusion)  RED FLAGS: None   WEIGHT BEARING RESTRICTIONS: No  FALLS:  Has patient fallen in last 6 months? Yes. 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   PLOF:  Independent  PATIENT GOALS: get knee stronger, wants to try to get foot stronger  NEXT MD VISIT: September  OBJECTIVE:  Note: Objective measures were completed at Evaluation unless otherwise noted.  DIAGNOSTIC FINDINGS:  07/21/23 L knee XR: IMPRESSION: 1. Severe lateral patellofemoral osteoarthritis, unchanged to mildly worsened from prior. 2. Mild medial and lateral compartment of the knee osteoarthritis.  07/21/23 R knee XR:  IMPRESSION: 1. Mild medial compartment and mild to moderate patellofemoral compartment osteoarthritis. 2. Mild patella alta. 3. Well corticated ossicle at the superior aspect of the tibial tubercle and deep aspect of the distal quadriceps tendon, likely the sequela of chronic Osgood-Schlatter disease.  PATIENT SURVEYS:  LEFS: 37/80  COGNITION: Overall cognitive status: Within functional limits for tasks assessed     SENSATION: LT intact but reduced distal RLE compared to L  Apparent motor control deficits RLE with closed chain tasks (walking, transfers)    LOWER EXTREMITY ROM:      Right eval Left eval  Hip flexion    Hip extension    Hip internal rotation    Hip external rotation    Knee extension full full  Knee flexion 110 deg 137 deg  (Blank rows = not tested) (Key: WFL = within functional limits not formally assessed, * = concordant pain, s = stiffness/stretching sensation, NT = not tested)  Comments:    LOWER EXTREMITY MMT:    MMT Right eval Left eval  Hip flexion    Hip abduction (modified sitting)    Hip internal rotation    Hip external rotation    Knee flexion 4 4+ *  Knee extension 4 4+  Ankle dorsiflexion 2+ 5   (Blank rows = not tested) (Key: WFL = within functional limits not formally assessed, * = concordant pain, s = stiffness/stretching sensation, NT = not tested)  Comments:     FUNCTIONAL TESTS:  5xSTS: 16.32sec w UE support, weight shift to L, reduced motor control R knee  GAIT: Distance walked:  within clinic Assistive device utilized: None Level of assistance: Complete Independence Comments: noted R knee instability w/ hyper extension in stance, mild inconsistency in placement of RLE, increased lateral sway, midfoot strike on RLE in slight inversion  TREATMENT DATE:  [facility] Adult PT Treatment:                                                DATE: 09/26/23 Therapeutic Exercise: Heel slides, quad set + iso hold, heel/toe raise practice reps; HEP handout + education, rationale for interventions, relevant anatomy/physiology     PATIENT EDUCATION:  Education details: Pt education on PT impairments, prognosis, and POC. Informed consent. Rationale for interventions, safe/appropriate HEP performance Person educated: Patient Education method: Explanation, Demonstration, Tactile cues, Verbal cues Education comprehension: verbalized understanding, returned demonstration, verbal cues required, tactile cues required, and needs further education    HOME EXERCISE PROGRAM: Access Code: [email or web address] URL: [email or web address] Date: 09/26/2023 Prepared by: Alm Jenny  Exercises - Seated Heel Slide  - 2-3 x daily - 1 sets - 8-10 reps - Seated Quad Set  - 2-3 x daily - 1 sets - 8-10 reps - 5sec hold - Seated Heel Toe Raises  - 2-3 x daily - 1 sets - 8-10 reps  ASSESSMENT:  CLINICAL IMPRESSION: Patient is a pleasant 53 y.o. woman who was seen today for physical therapy evaluation and treatment for BIL knee pain, R>L. Hx of lumbar surgery, nerve issues (including drop foot), and MS. She endorses pain with prolonged activity and instability of RLE with WB tasks. On exam she demonstrates reduced strength and motor control of RLE, reduced R knee ROM, altered gait/transfer mechanics, and 5xSTS time indicative of fall risk. Tolerates exam/HEP well without adverse  event. Recommend trial of skilled PT to address aforementioned deficits with aim of improving functional tolerance and reducing pain with typical activities. Pt departs today's session in no acute distress, all voiced concerns/questions addressed appropriately from PT perspective.      OBJECTIVE IMPAIRMENTS: Abnormal gait, decreased activity tolerance, decreased balance, decreased endurance, decreased mobility, difficulty walking, decreased ROM, decreased strength, impaired perceived functional ability, impaired sensation, and pain.   ACTIVITY LIMITATIONS: carrying, lifting, bending, sitting, standing, squatting, stairs, transfers, and locomotion level  PARTICIPATION LIMITATIONS: meal prep, cleaning, laundry, shopping, and community activity  PERSONAL FACTORS: Time since onset of injury/illness/exacerbation and 3+ comorbidities: HTN, peripheral neuropathy, DM2, MS are also affecting patient's functional outcome.   REHAB POTENTIAL: Good  CLINICAL DECISION MAKING: Evolving/moderate complexity  EVALUATION COMPLEXITY: Moderate   GOALS:  SHORT TERM GOALS: Target date: 10/24/2023  Pt will demonstrate appropriate understanding and performance of initially prescribed HEP in order to facilitate improved independence with management of symptoms.  Baseline: HEP established  Goal status: INITIAL   2. Pt will report at least 25% improvement in overall pain levels over past week in order to facilitate improved tolerance to typical daily activities.   Baseline: 0-5/10  Goal status: INITIAL    LONG TERM GOALS: Target date: 11/21/2023  Pt will score 50 or greater on LEFS in order to demonstrate improved perception of function due to symptoms (MCID 9 pts) Baseline: 37/80 Goal status: INITIAL  2.  Pt will demonstrate at least 0-125 degrees of R knee AROM in order to facilitate improved tolerance to functional movements such as squatting/stairs.  Baseline: see ROM chart above Goal status:  INITIAL  3.  Pt will be able navigate at least 1 flight of stairs with reciprocal pattern and unilateral UE support for improved home access. Baseline: reports step to pattern w UE support, subjective difficulty; not formally assessed on  eval Goal status: INITIAL  4.  Pt will be able to perform 5xSTS in less than or equal to 12sec in order to demonstrate reduced fall risk and improved functional independence (MCID 5xSTS = 2.3 sec). Baseline: 18sec w UE support Goal status: INITIAL   5. Pt will report at least 50% improvement in quality of stair navigation for improved home access.  Baseline: 2 level home, reports difficulty navigating  Goal status: INITIAL   PLAN:  PT FREQUENCY: 2x/week  PT DURATION: 8 weeks  PLANNED INTERVENTIONS: 97164- PT Re-evaluation, 97750- Physical Performance Testing, 97110-Therapeutic exercises, 97530- Therapeutic activity, [id number]- Neuromuscular re-education, 97535- Self Care, 02859- Manual therapy, [id number]- Gait training, [phone] (1-2 muscles), 20561 (3+ muscles)- Dry Needling, Patient/Family education, Balance training, Stair training, Taping, Joint mobilization, Cryotherapy, and Moist heat  PLAN FOR NEXT SESSION: Review/update HEP PRN. Work on [employer] exercises as appropriate with emphasis on quad/hamstring strength and control in open and closed chain. Equities trader. Pt also wants to work on ankle strength against gravity.   Alm DELENA Jenny PT, DPT 09/26/2023 12:53 PM

## 2023-09-26 ENCOUNTER — Encounter: Payer: Self-pay | Admitting: Physical Therapy

## 2023-09-26 ENCOUNTER — Ambulatory Visit: Attending: Sports Medicine | Admitting: Physical Therapy

## 2023-09-26 ENCOUNTER — Other Ambulatory Visit: Payer: Self-pay

## 2023-09-26 DIAGNOSIS — R2689 Other abnormalities of gait and mobility: Secondary | ICD-10-CM | POA: Insufficient documentation

## 2023-09-26 DIAGNOSIS — M170 Bilateral primary osteoarthritis of knee: Secondary | ICD-10-CM | POA: Diagnosis not present

## 2023-09-26 DIAGNOSIS — M6281 Muscle weakness (generalized): Secondary | ICD-10-CM | POA: Insufficient documentation

## 2023-09-26 DIAGNOSIS — M25562 Pain in left knee: Secondary | ICD-10-CM | POA: Diagnosis present

## 2023-09-26 DIAGNOSIS — M25561 Pain in right knee: Secondary | ICD-10-CM | POA: Diagnosis present

## 2023-09-27 ENCOUNTER — Encounter: Payer: Self-pay | Admitting: Urgent Care

## 2023-09-27 ENCOUNTER — Ambulatory Visit (INDEPENDENT_AMBULATORY_CARE_PROVIDER_SITE_OTHER): Admitting: Urgent Care

## 2023-09-27 VITALS — BP 107/71 | HR 72 | Resp 17 | Ht 66.0 in | Wt 214.2 lb

## 2023-09-27 DIAGNOSIS — Z1231 Encounter for screening mammogram for malignant neoplasm of breast: Secondary | ICD-10-CM

## 2023-09-27 DIAGNOSIS — M170 Bilateral primary osteoarthritis of knee: Secondary | ICD-10-CM

## 2023-09-27 DIAGNOSIS — M48061 Spinal stenosis, lumbar region without neurogenic claudication: Secondary | ICD-10-CM

## 2023-09-27 DIAGNOSIS — E1142 Type 2 diabetes mellitus with diabetic polyneuropathy: Secondary | ICD-10-CM

## 2023-09-27 DIAGNOSIS — E559 Vitamin D deficiency, unspecified: Secondary | ICD-10-CM

## 2023-09-27 DIAGNOSIS — G63 Polyneuropathy in diseases classified elsewhere: Secondary | ICD-10-CM

## 2023-09-27 DIAGNOSIS — F331 Major depressive disorder, recurrent, moderate: Secondary | ICD-10-CM | POA: Diagnosis not present

## 2023-09-27 DIAGNOSIS — E1169 Type 2 diabetes mellitus with other specified complication: Secondary | ICD-10-CM | POA: Diagnosis not present

## 2023-09-27 DIAGNOSIS — M48062 Spinal stenosis, lumbar region with neurogenic claudication: Secondary | ICD-10-CM

## 2023-09-27 DIAGNOSIS — E538 Deficiency of other specified B group vitamins: Secondary | ICD-10-CM

## 2023-09-27 DIAGNOSIS — E785 Hyperlipidemia, unspecified: Secondary | ICD-10-CM

## 2023-09-27 DIAGNOSIS — Z1211 Encounter for screening for malignant neoplasm of colon: Secondary | ICD-10-CM

## 2023-09-27 DIAGNOSIS — K219 Gastro-esophageal reflux disease without esophagitis: Secondary | ICD-10-CM

## 2023-09-27 DIAGNOSIS — R29898 Other symptoms and signs involving the musculoskeletal system: Secondary | ICD-10-CM

## 2023-09-27 DIAGNOSIS — G35 Multiple sclerosis: Secondary | ICD-10-CM

## 2023-09-27 DIAGNOSIS — Z7984 Long term (current) use of oral hypoglycemic drugs: Secondary | ICD-10-CM

## 2023-09-27 MED ORDER — DESVENLAFAXINE SUCCINATE ER 25 MG PO TB24: 25.0000 mg | ORAL_TABLET | Freq: Every day | ORAL | 2 refills | Status: DC

## 2023-09-27 NOTE — Progress Notes (Unsigned)
 New Patient Office Visit  Subjective:  Patient ID: Jeanette [name], female    DOB: 04-Sep-1970  Age: 53 y.o. MRN: [id number]  CC: No chief complaint on file.   HPI [name] presents to establish care ***  Outpatient Encounter Medications as of 09/27/2023  Medication Sig   Blood Glucose Monitoring Suppl (ONE TOUCH ULTRA 2) w/Device KIT See admin instructions.   conjugated estrogens (PREMARIN) vaginal cream Place 0.5 g vaginally.   dalfampridine 10 MG TB12 Take by mouth.   gabapentin  (NEURONTIN ) 600 MG tablet Take 600 mg by mouth 3 (three) times daily.   Glatiramer  Acetate (COPAXONE ) 40 MG/ML SOSY Inject 40 mg into the skin 3 (three) times a week.   HYDROcodone-acetaminophen (NORCO) 10-325 MG tablet Take 1 tablet by mouth 2 (two) times daily as needed.   metFORMIN  (GLUCOPHAGE ) 1000 MG tablet Take 1 tablet (1,000 mg total) by mouth 2 (two) times daily.   omeprazole  (PRILOSEC) 20 MG capsule Take 1 capsule (20 mg total) by mouth daily.   OZEMPIC , 1 MG/DOSE, 4 MG/3ML SOPN DIAL AND INJECT UNDER THE SKIN 1 MG WEEKLY   rosuvastatin  (CRESTOR ) 10 MG tablet Take 1 tablet by mouth at bedtime.   tiZANidine (ZANAFLEX) 4 MG tablet Take 1 tablet by mouth every 8 (eight) hours as needed.   traMADol  (ULTRAM ) 50 MG tablet Take 1 tablet (50 mg total) by mouth 3 (three) times daily as needed.   [DISCONTINUED] gabapentin  (NEURONTIN ) 300 MG capsule TAKE 1-2 CAPSULES BY MOUTH EVERY NIGHT AT BEDTIME (Patient taking differently: Take 300 mg by mouth 2 (two) times daily.)   [DISCONTINUED] lidocaine, PF, (XYLOCAINE) 1 % SOLN injection 4 mLs.   [DISCONTINUED] triamcinolone  acetonide (KENALOG -40) 40 MG/ML injection Inject 40 mg into the articular space.   [DISCONTINUED] Cholecalciferol 50 MCG (2000 UT) CAPS Take 2,000 Units by mouth. (Patient not taking: Reported on 09/27/2023)   [DISCONTINUED] diazepam (VALIUM) 5 MG tablet Take 5 mg by mouth. (Patient not taking: Reported on 09/27/2023)   [DISCONTINUED] DULoxetine  (CYMBALTA) 20 MG capsule Take 1 capsule by mouth 2 (two) times daily. (Patient not taking: Reported on 09/27/2023)   [DISCONTINUED] DULoxetine HCl 40 MG CPEP  (Patient not taking: Reported on 09/27/2023)   [DISCONTINUED] glipiZIDE  (GLUCOTROL ) 10 MG tablet Take 1 tablet (10 mg total) by mouth 2 (two) times daily before a meal. (Patient not taking: Reported on 09/27/2023)   [DISCONTINUED] lisinopril -hydrochlorothiazide  (ZESTORETIC ) 20-25 MG tablet Take 1 tablet by mouth daily.   [DISCONTINUED] methylPREDNISolone (MEDROL) 4 MG tablet See Admin Instructions. (Patient not taking: Reported on 09/27/2023)   [DISCONTINUED] sertraline  (ZOLOFT ) 50 MG tablet Take 1 tablet (50 mg total) by mouth daily. (Patient not taking: Reported on 09/27/2023)   [DISCONTINUED] traZODone  (DESYREL ) 150 MG tablet Take 1 tablet (150 mg total) by mouth at bedtime. (Patient not taking: Reported on 09/27/2023)   No facility-administered encounter medications on file as of 09/27/2023.    Past Medical History:  Diagnosis Date   Abnormal Pap smear of cervix    Allergy 2009   Sunflower seed   Anemia    Anxiety 2020   Arthritis 2000   Blood transfusion without reported diagnosis 2014 and 2017   Clotting disorder (HCC) 2017   Depression 2020   Dysmenorrhea    GERD (gastroesophageal reflux disease)    Hemorrhoids    Hypertension    Menorrhagia    Pelvic adhesions    Peripheral neuropathy 01/21/2016   Spinal stenosis at L4-L5 level 03/10/2016   Tubo-ovarian abscess 10/2015  White matter abnormality on MRI of brain 05/01/2018    Past Surgical History:  Procedure Laterality Date   APPENDECTOMY     ENDOMETRIAL ABLATION  08/16/2015   FRACTURE SURGERY  1990   MANDIBLE SURGERY     OOPHORECTOMY Right 10/2015   for ovarian abscess    Family History  Problem Relation Age of Onset   Diabetes Mother    Stroke Mother    Alcohol abuse Mother    Anxiety disorder Mother    Arthritis Mother    Depression Mother    Obesity  Mother    Diabetes Father    Hyperlipidemia Father    Hypertension Father    Heart attack Father    Arthritis Father    Hearing loss Father    Vision loss Father    Diabetes Brother    Cancer Brother    Hypertension Son    Heart attack Paternal Grandmother    Vision loss Paternal Grandmother    Heart attack Paternal Grandfather    Cancer Maternal Grandmother    Stroke Maternal Grandmother    ADD / ADHD Daughter    Depression Daughter    Brain cancer Brother    Cancer Brother    Early death Brother    Brain cancer Nephew    Diabetes type I Niece    Rickets Sister    Arthritis Sister    Birth defects Son     Social History   Socioeconomic History   Marital status: Significant Other    Spouse name: Not on file   Number of children: 5   Years of education: Not on file   Highest education level: Some college, no degree  Occupational History    Comment: disabled, not worked since 2020  Tobacco Use   Smoking status: Former    Current packs/day: 0.00    Average packs/day: 0.5 packs/day for 10.0 years (5.0 ttl pk-yrs)    Types: Cigarettes    Quit date: 03/24/2015    Years since quitting: 8.5   Smokeless tobacco: Never   Tobacco comments:    Quit in 2016  Vaping Use   Vaping status: Never Used  Substance and Sexual Activity   Alcohol use: Yes    Alcohol/week: 2.0 standard drinks of alcohol    Types: 2 Glasses of wine per week    Comment: social   Drug use: No   Sexual activity: Not Currently    Birth control/protection: Condom, None  Other Topics Concern   Not on file  Social History Narrative   Recently relocated from [place]  with her fiance   Caffeine- none   Social Drivers of Corporate investment banker Strain: Low Risk  (08/14/2023)   Overall Financial Resource Strain (CARDIA)    Difficulty of Paying Living Expenses: Not hard at all  Food Insecurity: No Food Insecurity (08/14/2023)   Hunger Vital Sign    Worried About Running Out of Food in the Last Year:  Never true    Ran Out of Food in the Last Year: Never true  Transportation Needs: No Transportation Needs (08/14/2023)   PRAPARE - Administrator, Civil Service (Medical): No    Lack of Transportation (Non-Medical): No  Physical Activity: Insufficiently Active (08/14/2023)   Exercise Vital Sign    Days of Exercise per Week: 3 days    Minutes of Exercise per Session: 10 min  Stress: Stress Concern Present (08/14/2023)   [employer] of Occupational Health - Occupational Stress Questionnaire  Feeling of Stress: Rather much  Social Connections: Moderately Integrated (08/14/2023)   Social Connection and Isolation Panel    Frequency of Communication with Friends and Family: More than three times a week    Frequency of Social Gatherings with Friends and Family: More than three times a week    Attends Religious Services: More than 4 times per year    Active Member of [employer] or Organizations: No    Attends Banker Meetings: Not on file    Marital Status: Living with partner  Intimate Partner Violence: Not At Risk (05/24/2023)   Received from [facility]   HITS    Over the last 12 months how often did your partner physically hurt you?: Never    Over the last 12 months how often did your partner insult you or talk down to you?: Never    Over the last 12 months how often did your partner threaten you with physical harm?: Never    Over the last 12 months how often did your partner scream or curse at you?: Never    ROS: as noted in HPI  Objective:  BP 107/71   Pulse 72   Resp 17   Ht 5' 6 (1.676 m)   Wt 214 lb 4 oz (97.2 kg)   SpO2 98%   BMI 34.58 kg/m   Physical Exam  {Labs (Optional):23779}  Assessment & Plan:  Primary osteoarthritis of both knees  Spinal stenosis of lumbar region with neurogenic claudication  Degenerative lumbar spinal stenosis  Weakness of right leg  Moderate episode of recurrent major depressive disorder (HCC)  Gastroesophageal  reflux disease, unspecified whether esophagitis present  MS (multiple sclerosis) (HCC)  Polyneuropathy associated with underlying disease (HCC)  Vitamin D  insufficiency    No follow-ups on file.   [name] LITTIE Gave, PA

## 2023-09-27 NOTE — Patient Instructions (Signed)
 So great to meet you! :)  Please start desvenlafaxine  daily. Keep your appointment with behavioral health in September. Continue all your other prescribed medications as directed.  We will refill tomorrow once labs have been reviewed.  Please schedule mammogram and colonoscopy. Please schedule routine annual diabetic retinopathy exam.  Schedule a follow up with me in 4 months, sooner as needed

## 2023-09-28 ENCOUNTER — Ambulatory Visit: Payer: Self-pay | Admitting: Urgent Care

## 2023-09-28 ENCOUNTER — Encounter: Payer: Self-pay | Admitting: Urgent Care

## 2023-09-28 DIAGNOSIS — Z791 Long term (current) use of non-steroidal anti-inflammatories (NSAID): Secondary | ICD-10-CM

## 2023-09-28 MED ORDER — OZEMPIC (1 MG/DOSE) 4 MG/3ML ~~LOC~~ SOPN: 1.0000 mg | PEN_INJECTOR | SUBCUTANEOUS | 5 refills | Status: AC

## 2023-09-28 MED ORDER — METFORMIN HCL 1000 MG PO TABS: 1000.0000 mg | ORAL_TABLET | Freq: Two times a day (BID) | ORAL | 3 refills | Status: AC

## 2023-09-28 MED ORDER — ROSUVASTATIN CALCIUM 10 MG PO TABS: 10.0000 mg | ORAL_TABLET | Freq: Every day | ORAL | 3 refills | Status: AC

## 2023-09-28 MED ORDER — OMEPRAZOLE 20 MG PO CPDR: 20.0000 mg | DELAYED_RELEASE_CAPSULE | Freq: Every day | ORAL | 3 refills | Status: AC

## 2023-09-29 LAB — LIPID PANEL
Chol/HDL Ratio: 3 ratio (ref 0.0–4.4)
Cholesterol, Total: 131 mg/dL (ref 100–199)
HDL: 44 mg/dL (ref >39)
LDL Chol Calc (NIH): 68 mg/dL (ref 0–99)
Triglycerides: 101 mg/dL (ref 0–149)
VLDL Cholesterol Cal: 19 mg/dL (ref 5–40)

## 2023-09-29 LAB — B12 AND FOLATE PANEL
Folate: 15.6 ng/mL (ref >3.0)
Vitamin B-12: 854 pg/mL (ref 232–1245)

## 2023-09-29 LAB — HEMOGLOBIN A1C
Est. average glucose Bld gHb Est-mCnc: 137 mg/dL
Hgb A1c MFr Bld: 6.4 % — ABNORMAL HIGH (ref 4.8–5.6)

## 2023-09-29 LAB — COMPREHENSIVE METABOLIC PANEL WITH GFR
ALT: 16 IU/L (ref 0–32)
AST: 16 IU/L (ref 0–40)
Albumin: 4.6 g/dL (ref 3.8–4.9)
Alkaline Phosphatase: 85 IU/L (ref 44–121)
BUN/Creatinine Ratio: 15 (ref 9–23)
BUN: 9 mg/dL (ref 6–24)
Bilirubin Total: 0.4 mg/dL (ref 0.0–1.2)
CO2: 21 mmol/L (ref 20–29)
Calcium: 9.7 mg/dL (ref 8.7–10.2)
Chloride: 103 mmol/L (ref 96–106)
Creatinine, Ser: 0.62 mg/dL (ref 0.57–1.00)
Globulin, Total: 2.5 g/dL (ref 1.5–4.5)
Glucose: 111 mg/dL — ABNORMAL HIGH (ref 70–99)
Potassium: 4.3 mmol/L (ref 3.5–5.2)
Sodium: 141 mmol/L (ref 134–144)
Total Protein: 7.1 g/dL (ref 6.0–8.5)
eGFR: 106 mL/min/1.73 (ref >59)

## 2023-09-29 LAB — CBC WITH DIFFERENTIAL/PLATELET
Basophils Absolute: 0 x10E3/uL (ref 0.0–0.2)
Basos: 1 %
EOS (ABSOLUTE): 0.1 x10E3/uL (ref 0.0–0.4)
Eos: 3 %
Hematocrit: 40.6 % (ref 34.0–46.6)
Hemoglobin: 13.3 g/dL (ref 11.1–15.9)
Immature Grans (Abs): 0 x10E3/uL (ref 0.0–0.1)
Immature Granulocytes: 0 %
Lymphocytes Absolute: 2.5 x10E3/uL (ref 0.7–3.1)
Lymphs: 46 %
MCH: 29.5 pg (ref 26.6–33.0)
MCHC: 32.8 g/dL (ref 31.5–35.7)
MCV: 90 fL (ref 79–97)
Monocytes Absolute: 0.4 x10E3/uL (ref 0.1–0.9)
Monocytes: 7 %
Neutrophils Absolute: 2.3 x10E3/uL (ref 1.4–7.0)
Neutrophils: 43 %
Platelets: 317 x10E3/uL (ref 150–450)
RBC: 4.51 x10E6/uL (ref 3.77–5.28)
RDW: 12.9 % (ref 11.7–15.4)
WBC: 5.5 x10E3/uL (ref 3.4–10.8)

## 2023-09-29 LAB — MICROALBUMIN / CREATININE URINE RATIO
Creatinine, Urine: 72.3 mg/dL
Microalb/Creat Ratio: <4 mg/g{creat} (ref 0–29)
Microalbumin, Urine: <3 ug/mL

## 2023-09-29 LAB — TSH: TSH: 0.851 u[IU]/mL (ref 0.450–4.500)

## 2023-09-29 LAB — VITAMIN D 25 HYDROXY (VIT D DEFICIENCY, FRACTURES): Vit D, 25-Hydroxy: 27.1 ng/mL — ABNORMAL LOW (ref 30.0–100.0)

## 2023-10-03 ENCOUNTER — Ambulatory Visit

## 2023-10-04 ENCOUNTER — Telehealth: Payer: Self-pay

## 2023-10-04 ENCOUNTER — Encounter: Payer: Self-pay | Admitting: Urgent Care

## 2023-10-04 ENCOUNTER — Ambulatory Visit (INDEPENDENT_AMBULATORY_CARE_PROVIDER_SITE_OTHER): Admitting: Urgent Care

## 2023-10-04 ENCOUNTER — Other Ambulatory Visit (HOSPITAL_COMMUNITY)
Admission: RE | Admit: 2023-10-04 | Discharge: 2023-10-04 | Disposition: A | Discharge status: Home / Self Care | Source: Ambulatory Visit | Attending: Urgent Care | Admitting: Urgent Care

## 2023-10-04 VITALS — BP 114/75 | HR 73 | Resp 17 | Ht 66.0 in | Wt 215.0 lb

## 2023-10-04 DIAGNOSIS — N8111 Cystocele, midline: Secondary | ICD-10-CM | POA: Diagnosis not present

## 2023-10-04 DIAGNOSIS — B081 Molluscum contagiosum: Secondary | ICD-10-CM

## 2023-10-04 DIAGNOSIS — Z124 Encounter for screening for malignant neoplasm of cervix: Secondary | ICD-10-CM | POA: Diagnosis present

## 2023-10-04 DIAGNOSIS — Z01419 Encounter for gynecological examination (general) (routine) without abnormal findings: Secondary | ICD-10-CM | POA: Diagnosis present

## 2023-10-04 DIAGNOSIS — Z1151 Encounter for screening for human papillomavirus (HPV): Secondary | ICD-10-CM | POA: Diagnosis not present

## 2023-10-04 DIAGNOSIS — F331 Major depressive disorder, recurrent, moderate: Secondary | ICD-10-CM | POA: Diagnosis not present

## 2023-10-04 MED ORDER — TRIAMCINOLONE ACETONIDE 0.5 % EX CREA: 1.0000 | TOPICAL_CREAM | Freq: Two times a day (BID) | CUTANEOUS | 0 refills | Status: AC

## 2023-10-04 MED ORDER — SERTRALINE HCL 25 MG PO TABS: 25.0000 mg | ORAL_TABLET | Freq: Every day | ORAL | 2 refills | Status: AC

## 2023-10-04 NOTE — Telephone Encounter (Signed)
 Pharmacy informed.

## 2023-10-04 NOTE — Telephone Encounter (Signed)
 Copied from CRM [id number]. Topic: Clinical - Medication Question >> Oct 04, 2023 10:29 AM Rosaria A wrote: Reason for CRM: [facility] is calling stating that they received a prescription for the patient today: sertraline  (ZOLOFT ) 25 MG tablet.  [facility] states that the patient is also taking Deszenlasaxine and is wanting to know if the patient will be taking both medications at the same time of if she needs to come off of Deszenlasaxine. Please advise.

## 2023-10-04 NOTE — Patient Instructions (Signed)
 Start zoloft  at 25mg  daily. If in two weeks you are tolerating well, you can increase to 50mg  (two tabs daily).  Use the topical triamcinolone  to the affected areas of your hands twice daily for NO LONGER than 14 consecutive days.  I have placed a referral to gynecology to discuss your urinary concerns.  For your vitamin D  - please pick up Vit D3 1000 units OTC and take daily.  Please follow up in 8 weeks for follow up on medication changes.

## 2023-10-04 NOTE — Progress Notes (Signed)
 Established Patient Office Visit  Subjective:  Patient ID: Jeanette [name], female    DOB: [birth year]  Age: 53 y.o. MRN: [id number]  Chief Complaint  Patient presents with   Gynecologic Exam    Pt presents for a routine pap smear. Has hx of abnormal pap in the past. Do c/o urinary frequency and incomplete emptying. Often times will have to visit the bathroom again shortly after just going. No dysuria or dribbling. No tx tried.  Pt tried Pristique x 4 days. Took at night, but still woke up in the morning feeling somnolent and lethargic. Did not take last evening dose and feels slightly better today.  C/o rash on dorsal hands. Started as one area on L hand, but now has two. Also one on the R dorsal hand. Present x <1 week.   Gynecologic Exam    Patient Active Problem List   Diagnosis Date Noted   Weakness of right leg 07/21/2023   COVID-19, past infectious stage, some mild postviral effects w/ cough/congestion 03/07/2020   Reactive depression 08/15/2018   MS (multiple sclerosis) (HCC) 07/19/2018   Thoracic disc herniation 07/19/2018   Lumbar radicular pain 05/18/2018   NSAID long-term use 05/18/2018   Diabetes type 2, uncontrolled 04/25/2018   Radicular syndrome of right lower extremity 04/25/2018   Primary osteoarthritis of both knees 10/13/2017   Seasonal allergic rhinitis 06/02/2017   Class 1 obesity due to excess calories with serious comorbidity and body mass index (BMI) of 34.0 to 34.9 in adult 10/25/2016   History of allergy to food additives 07/23/2016   At risk for HIV due to heterosexual contact 03/10/2016   Spinal stenosis of lumbar region with neurogenic claudication 03/10/2016   Lipoma of abdominal wall 02/04/2016   Vitamin D  insufficiency 01/23/2016   Tobacco dependency 01/23/2016   Hypertension associated with diabetes (HCC) 01/21/2016   Peripheral neuropathy 01/21/2016   History of anemia 01/21/2016   Past Medical History:  Diagnosis Date   Abnormal Pap smear  of cervix    Allergy 2009   Sunflower seed   Anemia    Anxiety 2020   Arthritis 2000   Blood transfusion without reported diagnosis 2014 and 2017   Clotting disorder (HCC) 2017   Depression 2020   Dysmenorrhea    GERD (gastroesophageal reflux disease)    Hemorrhoids    Hypertension    Menorrhagia    Pelvic adhesions    Peripheral neuropathy 01/21/2016   Spinal stenosis at L4-L5 level 03/10/2016   Tubo-ovarian abscess 10/2015   White matter abnormality on MRI of brain 05/01/2018   Past Surgical History:  Procedure Laterality Date   APPENDECTOMY     ENDOMETRIAL ABLATION  08/16/2015   FRACTURE SURGERY  1990   MANDIBLE SURGERY     OOPHORECTOMY Right 10/2015   for ovarian abscess   Social History   Tobacco Use   Smoking status: Former    Current packs/day: 0.00    Average packs/day: 0.5 packs/day for 10.0 years (5.0 ttl pk-yrs)    Types: Cigarettes    Quit date: 03/24/2015    Years since quitting: 8.5   Smokeless tobacco: Never   Tobacco comments:    Quit in 2016  Vaping Use   Vaping status: Never Used  Substance Use Topics   Alcohol use: Yes    Alcohol/week: 2.0 standard drinks of alcohol    Types: 2 Glasses of wine per week    Comment: social   Drug use: No  ROS: as noted in HPI  Objective:     BP 114/75   Pulse 73   Resp 17   Ht 5' 6 (1.676 m)   Wt 215 lb (97.5 kg)   SpO2 96%   BMI 34.70 kg/m  BP Readings from Last 3 Encounters:  10/04/23 114/75  09/27/23 107/71  10/09/20 (!) 161/90   Wt Readings from Last 3 Encounters:  10/04/23 215 lb (97.5 kg)  09/27/23 214 lb 4 oz (97.2 kg)  10/09/20 226 lb 1.3 oz (102.5 kg)      Physical Exam Vitals and nursing note reviewed. Exam conducted with a chaperone present.  Constitutional:      General: She is not in acute distress.    Appearance: Normal appearance. She is not ill-appearing or toxic-appearing.  HENT:     Head: Normocephalic.  Cardiovascular:     Rate and Rhythm: Normal rate.   Pulmonary:     Effort: Pulmonary effort is normal. No respiratory distress.  Genitourinary:    Pubic Area: No rash or pubic lice.      Labia:        Right: No rash, tenderness, lesion or injury.        Left: No rash, tenderness, lesion or injury.      Urethra: No prolapse, urethral pain, urethral swelling or urethral lesion.     Vagina: Normal. No vaginal discharge or lesions.     Cervix: Normal. No cervical motion tenderness, discharge, friability or lesion.     Uterus: Normal.      Adnexa: Right adnexa normal and left adnexa normal.       Right: No mass, tenderness or fullness.         Left: No mass, tenderness or fullness.       Rectum: Normal.     Comments: Midline cystocele Lymphadenopathy:     Lower Body: No right inguinal adenopathy. No left inguinal adenopathy.  Skin:    Findings: Rash (molluscum contagiosum noted to dorsal hands - three lesions with central umbilication) present.  Neurological:     General: No focal deficit present.     Mental Status: She is alert and oriented to person, place, and time.  Psychiatric:        Mood and Affect: Mood normal.        Behavior: Behavior normal.      No results found for any visits on 10/04/23.  Last CBC Lab Results  Component Value Date   WBC 5.5 09/27/2023   HGB 13.3 09/27/2023   HCT 40.6 09/27/2023   MCV 90 09/27/2023   MCH 29.5 09/27/2023   RDW 12.9 09/27/2023   PLT 317 09/27/2023   Last metabolic panel Lab Results  Component Value Date   GLUCOSE 111 (H) 09/27/2023   NA 141 09/27/2023   K 4.3 09/27/2023   CL 103 09/27/2023   CO2 21 09/27/2023   BUN 9 09/27/2023   CREATININE 0.62 09/27/2023   EGFR 106 09/27/2023   CALCIUM  9.7 09/27/2023   PROT 7.1 09/27/2023   ALBUMIN 4.6 09/27/2023   LABGLOB 2.5 09/27/2023   AGRATIO 2.0 06/28/2018   BILITOT 0.4 09/27/2023   ALKPHOS 85 09/27/2023   AST 16 09/27/2023   ALT 16 09/27/2023      The 10-year ASCVD risk score (Arnett DK, et al., 2019) is:  2.2%  Assessment & Plan:  Cystocele, midline -     Ambulatory referral to Obstetrics / Gynecology  Cervical cancer screening -  Cytology - PAP  Moderate episode of recurrent major depressive disorder (HCC) -     Sertraline  HCl; Take 1 tablet (25 mg total) by mouth daily.  Dispense: 90 tablet; Refill: 2  Molluscum contagiosum -     Triamcinolone  Acetonide; Apply 1 Application topically 2 (two) times daily. To affected areas.  Dispense: 30 g; Refill: 0  Pap smear completed today. Difficult time obtaining endocervix due to cystocele.  Will refer to gyne for further eval and management options of urinary complaints.  Pt did not tolerate Pristique. Wanting to switch back to zoloft  - was previously on 50mg  and doing well. Will restart at 25mg  with taper up in 2 weeks.  Rash on hands most consistent with molluscum. Will do trial of triamcinolone . Pt understands this is a viral rash.  Vit D insufficiency - pt will purchase OTC Vit D   Return in about 8 weeks (around 11/29/2023).   [name] LITTIE Gave, PA

## 2023-10-04 NOTE — Telephone Encounter (Signed)
 No patient has already stopped that medication due to adverse reactions

## 2023-10-05 ENCOUNTER — Telehealth: Payer: Self-pay

## 2023-10-05 ENCOUNTER — Ambulatory Visit

## 2023-10-05 ENCOUNTER — Ambulatory Visit: Payer: Self-pay | Admitting: Urgent Care

## 2023-10-05 DIAGNOSIS — M25561 Pain in right knee: Secondary | ICD-10-CM | POA: Diagnosis not present

## 2023-10-05 DIAGNOSIS — M6281 Muscle weakness (generalized): Secondary | ICD-10-CM

## 2023-10-05 DIAGNOSIS — M25562 Pain in left knee: Secondary | ICD-10-CM

## 2023-10-05 DIAGNOSIS — R2689 Other abnormalities of gait and mobility: Secondary | ICD-10-CM

## 2023-10-05 LAB — CYTOLOGY - PAP
Comment: NEGATIVE
Diagnosis: NEGATIVE
High risk HPV: NEGATIVE

## 2023-10-05 NOTE — Therapy (Addendum)
 OUTPATIENT PHYSICAL THERAPY LOWER EXTREMITY TREATMENT + NO VISIT DISCHARGE SUMMARY (see below)    Patient Name: Jeanette [name] MRN: [id number] DOB:[birth year], 53 y.o., female Today's Date: 10/05/2023  END OF SESSION:  PT End of Session - 10/05/23 1101     Visit Number 2    Number of Visits 17    Date for PT Re-Evaluation 11/21/23    Authorization Type [employer] MDC    Authorization Time Period no auth per notes    Progress Note Due on Visit 10    PT Start Time 1101    PT Stop Time 1143    PT Time Calculation (min) 42 min    Activity Tolerance Patient tolerated treatment well    Behavior During Therapy WFL for tasks assessed/performed          Past Medical History:  Diagnosis Date   Abnormal Pap smear of cervix    Allergy 2009   Sunflower seed   Anemia    Anxiety 2020   Arthritis 2000   Blood transfusion without reported diagnosis 2014 and 2017   Clotting disorder (HCC) 2017   Depression 2020   Dysmenorrhea    GERD (gastroesophageal reflux disease)    Hemorrhoids    Hypertension    Menorrhagia    Pelvic adhesions    Peripheral neuropathy 01/21/2016   Spinal stenosis at L4-L5 level 03/10/2016   Tubo-ovarian abscess 10/2015   White matter abnormality on MRI of brain 05/01/2018   Past Surgical History:  Procedure Laterality Date   APPENDECTOMY     ENDOMETRIAL ABLATION  08/16/2015   FRACTURE SURGERY  1990   MANDIBLE SURGERY     OOPHORECTOMY Right 10/2015   for ovarian abscess   Patient Active Problem List   Diagnosis Date Noted   Weakness of right leg 07/21/2023   COVID-19, past infectious stage, some mild postviral effects w/ cough/congestion 03/07/2020   Reactive depression 08/15/2018   MS (multiple sclerosis) (HCC) 07/19/2018   Thoracic disc herniation 07/19/2018   Lumbar radicular pain 05/18/2018   NSAID long-term use 05/18/2018   Diabetes type 2, uncontrolled 04/25/2018   Radicular syndrome of right lower extremity 04/25/2018   Primary osteoarthritis of both  knees 10/13/2017   Seasonal allergic rhinitis 06/02/2017   Class 1 obesity due to excess calories with serious comorbidity and body mass index (BMI) of 34.0 to 34.9 in adult 10/25/2016   History of allergy to food additives 07/23/2016   At risk for HIV due to heterosexual contact 03/10/2016   Spinal stenosis of lumbar region with neurogenic claudication 03/10/2016   Lipoma of abdominal wall 02/04/2016   Vitamin D  insufficiency 01/23/2016   Tobacco dependency 01/23/2016   Hypertension associated with diabetes (HCC) 01/21/2016   Peripheral neuropathy 01/21/2016   History of anemia 01/21/2016    PCP: No PCP in chart  REFERRING PROVIDER: Curtis Debby PARAS, MD  REFERRING DIAG: M17.0 (ICD-10-CM) - Primary osteoarthritis of both knees  THERAPY DIAG:  Pain in both knees, unspecified chronicity  Muscle weakness (generalized)  Other abnormalities of gait and mobility  Rationale for Evaluation and Treatment: Rehabilitation  ONSET DATE: ~2018  SUBJECTIVE:   SUBJECTIVE STATEMENT: Patient reports no changes since eval; states she is compliant with HEP.  EVAL: Reports history of R LE issues w/ back surgery, MS, and drop foot. States her R knee feels like it will hyperextend when she is walking. Tends to veer to R.  States she feels like back surgery helped with her pain but hasn't really changed  weakness. Does have L knee pain at times but mostly R sided. Has to use shopping cart in grocery store.  Got knee gel injections last week - feels like it helped pain quite a bit. States she was also told she may need an AFO.  PERTINENT HISTORY: HTN, peripheral neuropathy, DM2, MS Self reports drop foot R  PAIN:  Are you having pain: none Location/description: R>L knee Best-worst over past week: 0-5/10  - aggravating factors: walking ~66min, stairs, end of day after more activity - Easing factors: knee brace, rest, medication  PRECAUTIONS: MS, prior back surgery (2023, thinks she had a  fusion)  RED FLAGS: None   WEIGHT BEARING RESTRICTIONS: No  FALLS:  Has patient fallen in last 6 months? Yes. 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   PLOF: Independent  PATIENT GOALS: get knee stronger, wants to try to get foot stronger  NEXT MD VISIT: September  OBJECTIVE:  Note: Objective measures were completed at Evaluation unless otherwise noted.  DIAGNOSTIC FINDINGS:  07/21/23 L knee XR: IMPRESSION: 1. Severe lateral patellofemoral osteoarthritis, unchanged to mildly worsened from prior. 2. Mild medial and lateral compartment of the knee osteoarthritis.  07/21/23 R knee XR:  IMPRESSION: 1. Mild medial compartment and mild to moderate patellofemoral compartment osteoarthritis. 2. Mild patella alta. 3. Well corticated ossicle at the superior aspect of the tibial tubercle and deep aspect of the distal quadriceps tendon, likely the sequela of chronic Osgood-Schlatter disease.  PATIENT SURVEYS:  LEFS: 37/80  COGNITION: Overall cognitive status: Within functional limits for tasks assessed     SENSATION: LT intact but reduced distal RLE compared to L  Apparent motor control deficits RLE with closed chain tasks (walking, transfers)    LOWER EXTREMITY ROM:      Right eval Left eval  Hip flexion    Hip extension    Hip internal rotation    Hip external rotation    Knee extension full full  Knee flexion 110 deg 137 deg  (Blank rows = not tested) (Key: WFL = within functional limits not formally assessed, * = concordant pain, s = stiffness/stretching sensation, NT = not tested)  Comments:    LOWER EXTREMITY MMT:    MMT Right eval Left eval  Hip flexion    Hip abduction (modified sitting)    Hip internal  rotation    Hip external rotation    Knee flexion 4 4+ *  Knee extension 4 4+  Ankle dorsiflexion 2+ 5   (Blank rows = not tested) (Key: WFL = within functional limits not formally assessed, * = concordant pain, s = stiffness/stretching sensation, NT = not tested)  Comments:     FUNCTIONAL TESTS:  5xSTS: 16.32sec w UE support, weight shift to L, reduced motor control R knee  GAIT: Distance walked: within clinic Assistive device utilized: None Level of assistance: Complete Independence Comments: noted R knee instability w/ hyper extension in stance, mild inconsistency in placement of RLE, increased lateral sway, midfoot strike on RLE in slight inversion   [facility] Adult PT Treatment:  DATE: 10/05/2023 Neuromuscular re-ed: Prone quad set + assist for ankle DF stabilization Hooklying hip add isometric squeeze with ball b/w knees  Supine heel slides Straight leg raise Seated knee flexion with slider --> added yellow TB Therapeutic Activity: Ankle DF assisted stretch with yoga block & strap Assisted toe raise --> resisted foot press down + orange super band Sit to stand no UE support --> added red TB around thighs                                                                                                                                TREATMENT DATE:  [facility] Adult PT Treatment:                                                DATE: 09/26/23 Therapeutic Exercise: Heel slides, quad set + iso hold, heel/toe raise practice reps; HEP handout + education, rationale for interventions, relevant anatomy/physiology     PATIENT EDUCATION:  Education details: Pt education on PT impairments, prognosis, and POC. Informed consent. Rationale for interventions, safe/appropriate HEP performance Person educated: Patient Education method: Explanation, Demonstration, Tactile cues, Verbal cues Education comprehension: verbalized understanding, returned  demonstration, verbal cues required, tactile cues required, and needs further education    HOME EXERCISE PROGRAM: Access Code: [email or web address] URL: [email or web address] Date: 10/05/2023 Prepared by: Lamarr Price  Exercises - Seated Heel Slide  - 2-3 x daily - 1 sets - 8-10 reps - Seated Quad Set  - 2-3 x daily - 1 sets - 8-10 reps - 5sec hold - Seated Heel Toe Raises  - 2-3 x daily - 1 sets - 8-10 reps - Supine Hip Adduction Isometric with Ball  - 1 x daily - 7 x weekly - 1-2 sets - 5-8 reps - 5 sec hold - Hooklying Single Leg Bent Knee Fallouts with Resistance  - 1 x daily - 7 x weekly - 1-2 sets - 5-8 reps - Supine Heel Slide  - 1 x daily - 7 x weekly - 1-2 sets - 5-8 reps - Sit to Stand with Resistance Around Legs  - 1 x daily - 7 x weekly - 1-3 sets - 5-10 reps  ASSESSMENT:  CLINICAL IMPRESSION: Improved quad activation in prone position with assist for ankle dorsiflexion stabilization. Patient challenged with supine heel slides; cueing hip adduction stabilization improved body mechanics. Resistance band added around thighs to promote hip abd stabilization during sit to stand activity.  EVAL: Patient is a pleasant 53 y.o. woman who was seen today for physical therapy evaluation and treatment for BIL knee pain, R>L. Hx of lumbar surgery, nerve issues (including drop foot), and MS. She endorses pain with prolonged activity and instability of RLE with WB tasks. On exam she demonstrates reduced strength and motor control of RLE, reduced R knee ROM, altered  gait/transfer mechanics, and 5xSTS time indicative of fall risk. Tolerates exam/HEP well without adverse event. Recommend trial of skilled PT to address aforementioned deficits with aim of improving functional tolerance and reducing pain with typical activities. Pt departs today's session in no acute distress, all voiced concerns/questions addressed appropriately from PT perspective.      OBJECTIVE IMPAIRMENTS: Abnormal gait,  decreased activity tolerance, decreased balance, decreased endurance, decreased mobility, difficulty walking, decreased ROM, decreased strength, impaired perceived functional ability, impaired sensation, and pain.   ACTIVITY LIMITATIONS: carrying, lifting, bending, sitting, standing, squatting, stairs, transfers, and locomotion level  PARTICIPATION LIMITATIONS: meal prep, cleaning, laundry, shopping, and community activity  PERSONAL FACTORS: Time since onset of injury/illness/exacerbation and 3+ comorbidities: HTN, peripheral neuropathy, DM2, MS are also affecting patient's functional outcome.   REHAB POTENTIAL: Good  CLINICAL DECISION MAKING: Evolving/moderate complexity  EVALUATION COMPLEXITY: Moderate   GOALS:  SHORT TERM GOALS: Target date: 10/24/2023  Pt will demonstrate appropriate understanding and performance of initially prescribed HEP in order to facilitate improved independence with management of symptoms.  Baseline: HEP established  Goal status: INITIAL   2. Pt will report at least 25% improvement in overall pain levels over past week in order to facilitate improved tolerance to typical daily activities.   Baseline: 0-5/10  Goal status: INITIAL    LONG TERM GOALS: Target date: 11/21/2023  Pt will score 50 or greater on LEFS in order to demonstrate improved perception of function due to symptoms (MCID 9 pts) Baseline: 37/80 Goal status: INITIAL  2.  Pt will demonstrate at least 0-125 degrees of R knee AROM in order to facilitate improved tolerance to functional movements such as squatting/stairs.  Baseline: see ROM chart above Goal status: INITIAL  3.  Pt will be able navigate at least 1 flight of stairs with reciprocal pattern and unilateral UE support for improved home access. Baseline: reports step to pattern w UE support, subjective difficulty; not formally assessed on eval Goal status: INITIAL  4.  Pt will be able to perform 5xSTS in less than or equal to 12sec  in order to demonstrate reduced fall risk and improved functional independence (MCID 5xSTS = 2.3 sec). Baseline: 18sec w UE support Goal status: INITIAL   5. Pt will report at least 50% improvement in quality of stair navigation for improved home access.  Baseline: 2 level home, reports difficulty navigating  Goal status: INITIAL   PLAN:  PT FREQUENCY: 2x/week  PT DURATION: 8 weeks  PLANNED INTERVENTIONS: 97164- PT Re-evaluation, 97750- Physical Performance Testing, 97110-Therapeutic exercises, 97530- Therapeutic activity, [id number]- Neuromuscular re-education, 97535- Self Care, [id number]- Manual therapy, [id number]- Gait training, [phone] (1-2 muscles), 20561 (3+ muscles)- Dry Needling, Patient/Family education, Balance training, Stair training, Taping, Joint mobilization, Cryotherapy, and Moist heat  PLAN FOR NEXT SESSION: Review/update HEP PRN. Work on [employer] exercises as appropriate with emphasis on quad/hamstring strength and control in open and closed chain. Equities trader. Pt also wants to work on ankle strength against gravity.   Lamarr Price, PTA 10/05/2023 12:48 PM     Discharge addendum 10/21/2023  PHYSICAL THERAPY DISCHARGE SUMMARY  Visits from Start of Care: 2  Current functional level related to goals / functional outcomes: Unable to be assessed   Remaining deficits: Unable to be assessed   Education / Equipment: Unable to be assessed  Patient goals were unable to be assessed. Patient is being discharged due to changing clinics - please refer to 10/13/23 telephone encounter with Lamarr Price, PTA.   Alm DELENA Jenny PT,  DPT 10/21/2023 10:54 AM

## 2023-10-05 NOTE — Telephone Encounter (Signed)
 Copied from CRM [id number]. Topic: Clinical - Prescription Issue >> Oct 05, 2023  9:49 AM Graeme ORN wrote: Reason for CRM: Pharmacy called. States Patient prescribed traMADol  (ULTRAM ) 50 MG tablet at the beginning of month with one refill also show HYDROcodone -acetaminophen  (NORCO) 10-325 MG tablet. Wants to know if its ok to fill. Would like a call back. Thank You

## 2023-10-06 ENCOUNTER — Telehealth: Payer: Self-pay

## 2023-10-06 DIAGNOSIS — M170 Bilateral primary osteoarthritis of knee: Secondary | ICD-10-CM

## 2023-10-06 DIAGNOSIS — M48061 Spinal stenosis, lumbar region without neurogenic claudication: Secondary | ICD-10-CM

## 2023-10-06 DIAGNOSIS — M48062 Spinal stenosis, lumbar region with neurogenic claudication: Secondary | ICD-10-CM

## 2023-10-06 MED ORDER — TRAMADOL HCL 50 MG PO TABS: 50.0000 mg | ORAL_TABLET | Freq: Three times a day (TID) | ORAL | 1 refills | Status: AC | PRN

## 2023-10-06 MED ORDER — HYDROCODONE-ACETAMINOPHEN 10-325 MG PO TABS: 1.0000 | ORAL_TABLET | Freq: Two times a day (BID) | ORAL | 0 refills | Status: AC | PRN

## 2023-10-06 NOTE — Telephone Encounter (Signed)
 Yes I will call this in for her. thanks

## 2023-10-06 NOTE — Telephone Encounter (Signed)
 Copied from CRM #[id number]. Topic: Clinical - Prescription Issue >> Oct 06, 2023 12:29 PM Kevelyn M wrote: Reason for CRM: Pharmacy following up again with question. States Patient prescribed traMADol  (ULTRAM ) 50 MG tablet at the beginning of month with one refill also show HYDROcodone -acetaminophen  (NORCO) 10-325 MG tablet. Wants to know if its ok to fill. Would like a call back. Would like to take care of this before the weekend.  Call back #[phone]

## 2023-10-06 NOTE — Telephone Encounter (Signed)
 FYI- Pharmacist was looking for confirmation that o.k. to fill both prescriptions. Since both refills were sent today by [name] Gave, NP -  gave pharmacy approval to fill both medications.

## 2023-10-11 ENCOUNTER — Encounter: Payer: Self-pay | Admitting: Sports Medicine

## 2023-10-11 ENCOUNTER — Ambulatory Visit: Admitting: Physical Therapy

## 2023-10-11 NOTE — Therapy (Incomplete)
 OUTPATIENT PHYSICAL THERAPY LOWER EXTREMITY TREATMENT   Patient Name: Jeanette [name] MRN: [id number] DOB:1970-04-23, 53 y.o., female Today's Date: 10/11/2023  END OF SESSION:    Past Medical History:  Diagnosis Date   Abnormal Pap smear of cervix    Allergy 2009   Sunflower seed   Anemia    Anxiety 2020   Arthritis 2000   Blood transfusion without reported diagnosis 2014 and 2017   Clotting disorder (HCC) 2017   Depression 2020   Dysmenorrhea    GERD (gastroesophageal reflux disease)    Hemorrhoids    Hypertension    Menorrhagia    Pelvic adhesions    Peripheral neuropathy 01/21/2016   Spinal stenosis at L4-L5 level 03/10/2016   Tubo-ovarian abscess 10/2015   White matter abnormality on MRI of brain 05/01/2018   Past Surgical History:  Procedure Laterality Date   APPENDECTOMY     ENDOMETRIAL ABLATION  08/16/2015   FRACTURE SURGERY  1990   MANDIBLE SURGERY     OOPHORECTOMY Right 10/2015   for ovarian abscess   Patient Active Problem List   Diagnosis Date Noted   Weakness of right leg 07/21/2023   COVID-19, past infectious stage, some mild postviral effects w/ cough/congestion 03/07/2020   Reactive depression 08/15/2018   MS (multiple sclerosis) (HCC) 07/19/2018   Thoracic disc herniation 07/19/2018   Lumbar radicular pain 05/18/2018   NSAID long-term use 05/18/2018   Diabetes type 2, uncontrolled 04/25/2018   Radicular syndrome of right lower extremity 04/25/2018   Primary osteoarthritis of both knees 10/13/2017   Seasonal allergic rhinitis 06/02/2017   Class 1 obesity due to excess calories with serious comorbidity and body mass index (BMI) of 34.0 to 34.9 in adult 10/25/2016   History of allergy to food additives 07/23/2016   At risk for HIV due to heterosexual contact 03/10/2016   Spinal stenosis of lumbar region with neurogenic claudication 03/10/2016   Lipoma of abdominal wall 02/04/2016   Vitamin D  insufficiency 01/23/2016   Tobacco dependency 01/23/2016    Hypertension associated with diabetes (HCC) 01/21/2016   Peripheral neuropathy 01/21/2016   History of anemia 01/21/2016    PCP: No PCP in chart  REFERRING PROVIDER: Curtis Debby PARAS, MD  REFERRING DIAG: M17.0 (ICD-10-CM) - Primary osteoarthritis of both knees  THERAPY DIAG:  No diagnosis found.  Rationale for Evaluation and Treatment: Rehabilitation  ONSET DATE: ~2018  SUBJECTIVE:   SUBJECTIVE STATEMENT: 10/11/2023: ***  *** Patient reports no changes since eval; states she is compliant with HEP.  EVAL: Reports history of R LE issues w/ back surgery, MS, and drop foot. States her R knee feels like it will hyperextend when she is walking. Tends to veer to R.  States she feels like back surgery helped with her pain but hasn't really changed weakness. Does have L knee pain at times but mostly R sided. Has to use shopping cart in grocery store.  Got knee gel injections last week - feels like it helped pain quite a bit. States she was also told she may need an AFO.  PERTINENT HISTORY: HTN, peripheral neuropathy, DM2, MS Self reports drop foot R  PAIN:  Are you having pain: none Location/description: R>L knee Best-worst over past week: 0-5/10  - aggravating factors: walking ~10min, stairs, end of day after more activity - Easing factors: knee brace, rest, medication  PRECAUTIONS: MS, prior back surgery (2023, thinks she had a fusion)  RED FLAGS: None   WEIGHT BEARING RESTRICTIONS: No  FALLS:  Has patient  fallen in last 6 months? Yes. 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   PLOF: Independent  PATIENT GOALS: get knee stronger, wants to try to get foot stronger  NEXT MD VISIT: September  OBJECTIVE:  Note: Objective  measures were completed at Evaluation unless otherwise noted.  DIAGNOSTIC FINDINGS:  07/21/23 L knee XR: IMPRESSION: 1. Severe lateral patellofemoral osteoarthritis, unchanged to mildly worsened from prior. 2. Mild medial and lateral compartment of the knee osteoarthritis.  07/21/23 R knee XR:  IMPRESSION: 1. Mild medial compartment and mild to moderate patellofemoral compartment osteoarthritis. 2. Mild patella alta. 3. Well corticated ossicle at the superior aspect of the tibial tubercle and deep aspect of the distal quadriceps tendon, likely the sequela of chronic Osgood-Schlatter disease.  PATIENT SURVEYS:  LEFS: 37/80  COGNITION: Overall cognitive status: Within functional limits for tasks assessed     SENSATION: LT intact but reduced distal RLE compared to L  Apparent motor control deficits RLE with closed chain tasks (walking, transfers)    LOWER EXTREMITY ROM:      Right eval Left eval  Hip flexion    Hip extension    Hip internal rotation    Hip external rotation    Knee extension full full  Knee flexion 110 deg 137 deg  (Blank rows = not tested) (Key: WFL = within functional limits not formally assessed, * = concordant pain, s = stiffness/stretching sensation, NT = not tested)  Comments:    LOWER EXTREMITY MMT:    MMT Right eval Left eval  Hip flexion    Hip abduction (modified sitting)    Hip internal rotation    Hip external rotation    Knee flexion 4 4+ *  Knee extension 4 4+  Ankle dorsiflexion 2+ 5   (Blank rows = not tested) (Key: WFL = within functional limits not formally assessed, * = concordant pain, s = stiffness/stretching sensation, NT = not tested)  Comments:     FUNCTIONAL TESTS:  5xSTS: 16.32sec w UE support, weight shift to L, reduced motor control R knee  GAIT: Distance walked: within clinic Assistive device utilized: None Level of assistance: Complete Independence Comments: noted R knee instability w/ hyper extension  in stance, mild inconsistency in placement of RLE, increased lateral sway, midfoot strike on RLE in slight inversion   [facility] Adult PT Treatment:                                                DATE: 10/11/23 Therapeutic Exercise: *** Manual Therapy: *** Neuromuscular re-ed: *** Therapeutic Activity: *** Modalities: *** Self Care: ***    [facility] Adult PT Treatment:                                                DATE: 10/05/2023 Neuromuscular re-ed: Prone quad set + assist for ankle DF stabilization Hooklying hip add isometric squeeze with ball b/w knees  Supine heel slides Straight leg raise Seated knee flexion with slider -->  added yellow TB Therapeutic Activity: Ankle DF assisted stretch with yoga block & strap Assisted toe raise --> resisted foot press down + orange super band Sit to stand no UE support --> added red TB around thighs                                                                                                                                 [facility] Adult PT Treatment:                                                DATE: 09/26/23 Therapeutic Exercise: Heel slides, quad set + iso hold, heel/toe raise practice reps; HEP handout + education, rationale for interventions, relevant anatomy/physiology     PATIENT EDUCATION:  Education details: rationale for interventions, HEP  Person educated: Patient Education method: Explanation, Demonstration, Tactile cues, Verbal cues Education comprehension: verbalized understanding, returned demonstration, verbal cues required, tactile cues required, and needs further education     HOME EXERCISE PROGRAM: Access Code: [email or web address] URL: [email or web address] Date: 10/05/2023 Prepared by: Lamarr Price  Exercises - Seated Heel Slide  - 2-3 x daily - 1 sets - 8-10 reps - Seated Quad Set  - 2-3 x daily - 1 sets - 8-10 reps - 5sec hold - Seated Heel Toe Raises  - 2-3 x daily - 1 sets - 8-10 reps - Supine Hip Adduction  Isometric with Ball  - 1 x daily - 7 x weekly - 1-2 sets - 5-8 reps - 5 sec hold - Hooklying Single Leg Bent Knee Fallouts with Resistance  - 1 x daily - 7 x weekly - 1-2 sets - 5-8 reps - Supine Heel Slide  - 1 x daily - 7 x weekly - 1-2 sets - 5-8 reps - Sit to Stand with Resistance Around Legs  - 1 x daily - 7 x weekly - 1-3 sets - 5-10 reps  ASSESSMENT:  CLINICAL IMPRESSION: 10/11/2023: ***  *** Improved quad activation in prone position with assist for ankle dorsiflexion stabilization. Patient challenged with supine heel slides; cueing hip adduction stabilization improved body mechanics. Resistance band added around thighs to promote hip abd stabilization during sit to stand activity.  EVAL: Patient is a pleasant 53 y.o. woman who was seen today for physical therapy evaluation and treatment for BIL knee pain, R>L. Hx of lumbar surgery, nerve issues (including drop foot), and MS. She endorses pain with prolonged activity and instability of RLE with WB tasks. On exam she demonstrates reduced strength and motor control of RLE, reduced R knee ROM, altered gait/transfer mechanics, and 5xSTS time indicative of fall risk. Tolerates exam/HEP well without adverse event. Recommend trial of skilled PT to address aforementioned deficits with aim of improving functional tolerance and reducing pain with typical activities. Pt departs today's session in no acute  distress, all voiced concerns/questions addressed appropriately from PT perspective.      OBJECTIVE IMPAIRMENTS: Abnormal gait, decreased activity tolerance, decreased balance, decreased endurance, decreased mobility, difficulty walking, decreased ROM, decreased strength, impaired perceived functional ability, impaired sensation, and pain.   ACTIVITY LIMITATIONS: carrying, lifting, bending, sitting, standing, squatting, stairs, transfers, and locomotion level  PARTICIPATION LIMITATIONS: meal prep, cleaning, laundry, shopping, and community  activity  PERSONAL FACTORS: Time since onset of injury/illness/exacerbation and 3+ comorbidities: HTN, peripheral neuropathy, DM2, MS are also affecting patient's functional outcome.   REHAB POTENTIAL: Good  CLINICAL DECISION MAKING: Evolving/moderate complexity  EVALUATION COMPLEXITY: Moderate   GOALS:  SHORT TERM GOALS: Target date: 10/24/2023  Pt will demonstrate appropriate understanding and performance of initially prescribed HEP in order to facilitate improved independence with management of symptoms.  Baseline: HEP established  Goal status: INITIAL   2. Pt will report at least 25% improvement in overall pain levels over past week in order to facilitate improved tolerance to typical daily activities.   Baseline: 0-5/10  Goal status: INITIAL    LONG TERM GOALS: Target date: 11/21/2023  Pt will score 50 or greater on LEFS in order to demonstrate improved perception of function due to symptoms (MCID 9 pts) Baseline: 37/80 Goal status: INITIAL  2.  Pt will demonstrate at least 0-125 degrees of R knee AROM in order to facilitate improved tolerance to functional movements such as squatting/stairs.  Baseline: see ROM chart above Goal status: INITIAL  3.  Pt will be able navigate at least 1 flight of stairs with reciprocal pattern and unilateral UE support for improved home access. Baseline: reports step to pattern w UE support, subjective difficulty; not formally assessed on eval Goal status: INITIAL  4.  Pt will be able to perform 5xSTS in less than or equal to 12sec in order to demonstrate reduced fall risk and improved functional independence (MCID 5xSTS = 2.3 sec). Baseline: 18sec w UE support Goal status: INITIAL   5. Pt will report at least 50% improvement in quality of stair navigation for improved home access.  Baseline: 2 level home, reports difficulty navigating  Goal status: INITIAL   PLAN:  PT FREQUENCY: 2x/week  PT DURATION: 8 weeks  PLANNED  INTERVENTIONS: 97164- PT Re-evaluation, 97750- Physical Performance Testing, 97110-Therapeutic exercises, 97530- Therapeutic activity, [id number]- Neuromuscular re-education, 97535- Self Care, [id number]- Manual therapy, [id number]- Gait training, [phone] (1-2 muscles), [id number] (3+ muscles)- Dry Needling, Patient/Family education, Balance training, Stair training, Taping, Joint mobilization, Cryotherapy, and Moist heat  PLAN FOR NEXT SESSION: Review/update HEP PRN. Work on [employer] exercises as appropriate with emphasis on quad/hamstring strength and control in open and closed chain. Equities trader. Pt also wants to work on ankle strength against gravity.   Alm DELENA Jenny PT, DPT 10/11/2023 7:55 AM

## 2023-10-13 ENCOUNTER — Ambulatory Visit

## 2023-10-13 ENCOUNTER — Encounter: Payer: Self-pay | Admitting: Urgent Care

## 2023-10-13 ENCOUNTER — Telehealth: Payer: Self-pay

## 2023-10-13 NOTE — Telephone Encounter (Signed)
 Called patient about missed appointment today; patient answered and she states she had left a voicemail on Monday 10/10/23 that her neurologist is referring her to another PT clinic, which is also closer to her. I have cancelled her remaining appointments and communicated with evaluating PT to discharge her POC.  Lamarr Price, PTA 10/13/2023, 12:18 PM

## 2023-10-14 ENCOUNTER — Encounter: Payer: Self-pay | Admitting: Urgent Care

## 2023-10-14 DIAGNOSIS — M79644 Pain in right finger(s): Secondary | ICD-10-CM

## 2023-10-14 NOTE — Telephone Encounter (Signed)

## 2023-10-17 ENCOUNTER — Ambulatory Visit

## 2023-10-19 ENCOUNTER — Ambulatory Visit: Admitting: Physical Therapy

## 2023-11-01 ENCOUNTER — Ambulatory Visit: Admitting: Sports Medicine

## 2023-11-03 ENCOUNTER — Encounter: Admitting: Obstetrics and Gynecology

## 2023-11-04 ENCOUNTER — Ambulatory Visit: Admitting: Urgent Care

## 2023-11-15 ENCOUNTER — Encounter: Payer: Self-pay | Admitting: Urgent Care

## 2023-11-15 ENCOUNTER — Telehealth: Payer: Self-pay

## 2023-11-15 NOTE — Telephone Encounter (Signed)
 This is a duplicate message. I sent her one directly on mychart thus no further action indicated. Thanks

## 2023-11-15 NOTE — Telephone Encounter (Signed)
 Please Advise??  Copied from CRM [id number]. Topic: Clinical - Medication Question >> Nov 15, 2023  3:07 PM Antony RAMAN wrote: Reason for CRM: pt called today wanting to know why HYDROcodone -acetaminophen  (NORCO) 10-325 MG tablet was sent to the pharmacy for her. She stated that her pain management doctor since 2021 is the one who prescribes that for her. Please advise pt

## 2023-11-29 ENCOUNTER — Telehealth (HOSPITAL_BASED_OUTPATIENT_CLINIC_OR_DEPARTMENT_OTHER): Payer: Self-pay

## 2023-12-08 ENCOUNTER — Encounter (HOSPITAL_BASED_OUTPATIENT_CLINIC_OR_DEPARTMENT_OTHER): Payer: Self-pay

## 2023-12-08 ENCOUNTER — Ambulatory Visit (HOSPITAL_BASED_OUTPATIENT_CLINIC_OR_DEPARTMENT_OTHER)

## 2023-12-15 ENCOUNTER — Encounter: Admitting: Obstetrics and Gynecology

## 2023-12-26 ENCOUNTER — Telehealth: Payer: Self-pay

## 2023-12-26 NOTE — Telephone Encounter (Signed)
 Contacted patient via MyChart in regards to previous office visit with Whitney on 10/04/2023. This appointment was to address our diabetes mellitus. During that visit we advised a 4 month follow-up was suggested for continuation of care. Advised I have taken notice pt has not set an appointment with PCP, [name] Gave , in regards to a diabetic follow-up. Advised to contact our office at their earliest convenience to schedule an appointment.

## 2024-01-14 ENCOUNTER — Other Ambulatory Visit: Payer: Self-pay | Admitting: Medical Genetics

## 2024-01-20 ENCOUNTER — Ambulatory Visit: Admitting: Urgent Care

## 2024-01-27 ENCOUNTER — Ambulatory Visit: Admitting: Urgent Care

## 2024-02-23 ENCOUNTER — Ambulatory Visit: Admitting: Urgent Care

## 2024-03-02 ENCOUNTER — Other Ambulatory Visit

## 2024-03-07 ENCOUNTER — Other Ambulatory Visit: Payer: Self-pay | Admitting: Medical Genetics

## 2024-03-07 DIAGNOSIS — Z006 Encounter for examination for normal comparison and control in clinical research program: Secondary | ICD-10-CM
